# Patient Record
Sex: Female | Born: 1985 | Race: Black or African American | Hispanic: No | Marital: Single | Smoking: Current every day smoker
Health system: Southern US, Community
[De-identification: ages and names within clinical notes are randomized; demographics above are authoritative.]

## PROBLEM LIST (undated history)

## (undated) DIAGNOSIS — I10 Essential (primary) hypertension: Secondary | ICD-10-CM

## (undated) HISTORY — PX: NO PAST SURGERIES: SHX2092

---
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
## 2009-08-19 ENCOUNTER — Emergency Department (HOSPITAL_COMMUNITY): Admission: EM | Admit: 2009-08-19 | Discharge: 2009-08-19 | Payer: Self-pay | Admitting: Family Medicine

## 2009-09-22 ENCOUNTER — Ambulatory Visit: Payer: Self-pay | Admitting: Nurse Practitioner

## 2009-09-22 ENCOUNTER — Inpatient Hospital Stay (HOSPITAL_COMMUNITY): Admission: AD | Admit: 2009-09-22 | Discharge: 2009-09-22 | Payer: Self-pay | Admitting: Family Medicine

## 2009-10-27 ENCOUNTER — Emergency Department (HOSPITAL_COMMUNITY): Admission: EM | Admit: 2009-10-27 | Discharge: 2009-10-27 | Payer: Self-pay | Admitting: Emergency Medicine

## 2009-12-13 ENCOUNTER — Inpatient Hospital Stay (HOSPITAL_COMMUNITY): Admission: AD | Admit: 2009-12-13 | Discharge: 2009-12-13 | Payer: Self-pay | Admitting: Obstetrics and Gynecology

## 2010-01-12 ENCOUNTER — Inpatient Hospital Stay (HOSPITAL_COMMUNITY): Admission: AD | Admit: 2010-01-12 | Discharge: 2010-01-12 | Payer: Self-pay | Admitting: Obstetrics & Gynecology

## 2010-01-16 ENCOUNTER — Ambulatory Visit (HOSPITAL_COMMUNITY): Admission: RE | Admit: 2010-01-16 | Discharge: 2010-01-16 | Payer: Self-pay | Admitting: Obstetrics & Gynecology

## 2010-04-19 ENCOUNTER — Inpatient Hospital Stay (HOSPITAL_COMMUNITY)
Admission: AD | Admit: 2010-04-19 | Discharge: 2010-04-19 | Payer: Self-pay | Source: Home / Self Care | Attending: Obstetrics & Gynecology | Admitting: Obstetrics & Gynecology

## 2010-04-19 LAB — URINALYSIS, ROUTINE W REFLEX MICROSCOPIC
Bilirubin Urine: NEGATIVE
Nitrite: NEGATIVE
Specific Gravity, Urine: 1.02 (ref 1.005–1.030)
Urobilinogen, UA: 0.2 mg/dL (ref 0.0–1.0)

## 2010-04-19 LAB — CBC
HCT: 36.2 % (ref 36.0–46.0)
Hemoglobin: 12.1 g/dL (ref 12.0–15.0)
MCH: 27.3 pg (ref 26.0–34.0)
MCHC: 33.4 g/dL (ref 30.0–36.0)
MCV: 81.5 fL (ref 78.0–100.0)
Platelets: 195 10*3/uL (ref 150–400)
RBC: 4.44 MIL/uL (ref 3.87–5.11)
RDW: 14 % (ref 11.5–15.5)
WBC: 8.1 10*3/uL (ref 4.0–10.5)

## 2010-04-19 LAB — COMPREHENSIVE METABOLIC PANEL
ALT: 12 U/L (ref 0–35)
AST: 21 U/L (ref 0–37)
CO2: 21 mEq/L (ref 19–32)
Chloride: 105 mEq/L (ref 96–112)
Creatinine, Ser: 0.89 mg/dL (ref 0.4–1.2)
GFR calc Af Amer: >60 mL/min (ref >60)
GFR calc non Af Amer: >60 mL/min (ref >60)
Glucose, Bld: 70 mg/dL (ref 70–99)
Sodium: 135 mEq/L (ref 135–145)
Total Bilirubin: 0.5 mg/dL (ref 0.3–1.2)

## 2010-04-19 LAB — WET PREP, GENITAL
Clue Cells Wet Prep HPF POC: NONE SEEN
Trich, Wet Prep: NONE SEEN

## 2010-04-19 LAB — URINE MICROSCOPIC-ADD ON

## 2010-04-21 ENCOUNTER — Encounter (INDEPENDENT_AMBULATORY_CARE_PROVIDER_SITE_OTHER): Payer: Self-pay | Admitting: *Deleted

## 2010-04-21 LAB — STREP B DNA PROBE: Strep Group B Ag: NEGATIVE

## 2010-04-21 LAB — CONVERTED CEMR LAB: Protein, 24H Urine: 86 mg/24hr (ref 50–100)

## 2010-04-23 ENCOUNTER — Ambulatory Visit: Admit: 2010-04-23 | Payer: Self-pay | Admitting: Obstetrics and Gynecology

## 2010-04-23 ENCOUNTER — Inpatient Hospital Stay (HOSPITAL_COMMUNITY)
Admission: AD | Admit: 2010-04-23 | Discharge: 2010-04-25 | DRG: 775 | Disposition: A | Discharge status: Home / Self Care | Payer: Medicaid Other | Attending: Obstetrics & Gynecology | Admitting: Obstetrics & Gynecology

## 2010-04-23 DIAGNOSIS — O0930 Supervision of pregnancy with insufficient antenatal care, unspecified trimester: Secondary | ICD-10-CM

## 2010-04-23 DIAGNOSIS — O139 Gestational [pregnancy-induced] hypertension without significant proteinuria, unspecified trimester: Principal | ICD-10-CM | POA: Diagnosis present

## 2010-04-23 LAB — COMPREHENSIVE METABOLIC PANEL
ALT: 10 U/L (ref 0–35)
CO2: 22 mEq/L (ref 19–32)
Calcium: 8.9 mg/dL (ref 8.4–10.5)
GFR calc non Af Amer: >60 mL/min (ref >60)
Glucose, Bld: 86 mg/dL (ref 70–99)
Sodium: 132 mEq/L — ABNORMAL LOW (ref 135–145)

## 2010-04-23 LAB — CBC
HCT: 34.1 % — ABNORMAL LOW (ref 36.0–46.0)
HCT: 33.8 % — ABNORMAL LOW (ref 36.0–46.0)
Hemoglobin: 11.5 g/dL — ABNORMAL LOW (ref 12.0–15.0)
Hemoglobin: 11.3 g/dL — ABNORMAL LOW (ref 12.0–15.0)
MCH: 27.6 pg (ref 26.0–34.0)
MCH: 27.1 pg (ref 26.0–34.0)
MCHC: 33.7 g/dL (ref 30.0–36.0)
MCV: 81.1 fL (ref 78.0–100.0)
Platelets: 186 10*3/uL (ref 150–400)
RBC: 4.17 MIL/uL (ref 3.87–5.11)

## 2010-04-23 LAB — URINALYSIS, ROUTINE W REFLEX MICROSCOPIC
Ketones, ur: NEGATIVE mg/dL
Leukocytes, UA: NEGATIVE
Nitrite: NEGATIVE
Protein, ur: NEGATIVE mg/dL
Urobilinogen, UA: 0.2 mg/dL (ref 0.0–1.0)

## 2010-04-23 LAB — RAPID URINE DRUG SCREEN, HOSP PERFORMED
Barbiturates: NOT DETECTED
Benzodiazepines: NOT DETECTED

## 2010-04-23 LAB — PROTEIN / CREATININE RATIO, URINE: Protein Creatinine Ratio: 0.21 — ABNORMAL HIGH (ref 0.00–0.15)

## 2010-04-23 LAB — URINE MICROSCOPIC-ADD ON

## 2010-05-24 ENCOUNTER — Inpatient Hospital Stay (INDEPENDENT_AMBULATORY_CARE_PROVIDER_SITE_OTHER)
Admission: RE | Admit: 2010-05-24 | Discharge: 2010-05-24 | Disposition: A | Discharge status: Home / Self Care | Payer: Medicaid Other | Source: Ambulatory Visit | Attending: Family Medicine | Admitting: Family Medicine

## 2010-05-24 DIAGNOSIS — S335XXA Sprain of ligaments of lumbar spine, initial encounter: Secondary | ICD-10-CM

## 2010-06-06 LAB — URINALYSIS, ROUTINE W REFLEX MICROSCOPIC
Nitrite: NEGATIVE
Specific Gravity, Urine: 1.02 (ref 1.005–1.030)
Urobilinogen, UA: 0.2 mg/dL (ref 0.0–1.0)

## 2010-06-06 LAB — HIV ANTIBODY (ROUTINE TESTING W REFLEX): HIV: NONREACTIVE

## 2010-06-06 LAB — TYPE AND SCREEN: ABO/RH(D): A POS

## 2010-06-06 LAB — HEPATITIS B SURFACE ANTIGEN: Hepatitis B Surface Ag: NEGATIVE

## 2010-06-06 LAB — CBC
MCH: 28.7 pg (ref 26.0–34.0)
MCHC: 34 g/dL (ref 30.0–36.0)
MCV: 84.4 fL (ref 78.0–100.0)
Platelets: 210 10*3/uL (ref 150–400)

## 2010-06-06 LAB — URINE MICROSCOPIC-ADD ON

## 2010-06-06 LAB — DIFFERENTIAL
Basophils Relative: 0 % (ref 0–1)
Eosinophils Absolute: 0.1 10*3/uL (ref 0.0–0.7)
Eosinophils Relative: 1 % (ref 0–5)
Monocytes Relative: 7 % (ref 3–12)
Neutrophils Relative %: 78 % — ABNORMAL HIGH (ref 43–77)

## 2010-06-07 LAB — URINALYSIS, ROUTINE W REFLEX MICROSCOPIC
Glucose, UA: NEGATIVE mg/dL
Ketones, ur: NEGATIVE mg/dL
Nitrite: NEGATIVE
Protein, ur: NEGATIVE mg/dL

## 2010-06-07 LAB — URINE MICROSCOPIC-ADD ON

## 2010-06-07 LAB — GC/CHLAMYDIA PROBE AMP, URINE
Chlamydia, Swab/Urine, PCR: NEGATIVE
GC Probe Amp, Urine: NEGATIVE

## 2010-08-10 NOTE — Op Note (Signed)
[facility]  Patient:    Bianca Jenkins, Bianca Jenkins Visit Number: [id number] MRN: [id number]          Service Type: OBS Location: [id number] Attending Physician:  Pleas Koch Dictated by:   Georgina Peer, M.D. Proc. Date: 07/20/01 Admit Date:  07/19/2001                             Operative Report  PREOPERATIVE DIAGNOSES:       1. Forty-one and 6/7 weeks intrauterine                                  pregnancy.                               2. Maternal fatigue, requests delivery                                  assistance with vacuum extractor.  POSTOPERATIVE DIAGNOSES:      1. Forty-one and 6/7 weeks intrauterine                                  pregnancy.                               2. Maternal fatigue, requests delivery                                  assistance with vacuum extractor with                                  viable female delivered.  OPERATION:  SURGEON:                      Georgina Peer, M.D.  ANESTHESIA:                   Epidural.  ESTIMATED BLOOD LOSS:         300 cc at delivery, laceration first degree perineal.  FINDINGS:                     Delivered a viable female infant at 2:45 a.m., Apgars 8 and 9.  INDICATIONS:                  A 25 year old primigravida, [facility] July 07, 2001,. presented in labor, progressing to complete dilatation at [facility] on April 28. Pushed well but became fatigued and at 2:40 a.m. requested assistance.  The vertex was at +3 OA.  Vacuum extractor was presented and accepted.  Risks and complications including caput formation, scalp bleeding, cephalhematoma, scalp or intracranial infection discussed and accepted.  It was thought that the patient would deliver within one or two pushes.  The Kiwi vacuum device was applied and within two pushes, the patient delivered at 2:45 a.m. a viable female infant.  Apgars at 8 and 9.  Loose nuchal cord x 1.  There was a first degree perineal  laceration.   It was a female infant.  Cord was doubly clamped and cut and infant cried spontaneously.  The placenta was delivered spontaneously at 2:50 a.m.  The laceration was repaired using 1% Xylocaine and chromic suture.  Estimated blood loss was 300 cc.  Circumcision desired.  Baby to regular nursery with a normal postpartum course. Dictated by:   Georgina Peer, M.D. Attending Physician:  Pleas Koch DD:  07/20/01 TD:  07/20/01 Job: [id number]

## 2012-07-26 ENCOUNTER — Encounter (HOSPITAL_COMMUNITY): Payer: Self-pay | Admitting: *Deleted

## 2012-07-26 ENCOUNTER — Inpatient Hospital Stay (HOSPITAL_COMMUNITY)
Admission: AD | Admit: 2012-07-26 | Discharge: 2012-07-26 | Disposition: A | Discharge status: Home / Self Care | Payer: Self-pay | Source: Ambulatory Visit | Attending: Obstetrics & Gynecology | Admitting: Obstetrics & Gynecology

## 2012-07-26 DIAGNOSIS — B9689 Other specified bacterial agents as the cause of diseases classified elsewhere: Secondary | ICD-10-CM

## 2012-07-26 DIAGNOSIS — A499 Bacterial infection, unspecified: Secondary | ICD-10-CM | POA: Insufficient documentation

## 2012-07-26 DIAGNOSIS — N949 Unspecified condition associated with female genital organs and menstrual cycle: Secondary | ICD-10-CM | POA: Insufficient documentation

## 2012-07-26 DIAGNOSIS — N760 Acute vaginitis: Secondary | ICD-10-CM

## 2012-07-26 HISTORY — DX: Essential (primary) hypertension: I10

## 2012-07-26 LAB — WET PREP, GENITAL: Trich, Wet Prep: NONE SEEN

## 2012-07-26 MED ORDER — METRONIDAZOLE 500 MG PO TABS: 500.0000 mg | ORAL_TABLET | Freq: Two times a day (BID) | ORAL | Status: DC

## 2012-07-26 NOTE — MAU Provider Note (Signed)
Attestation of Attending Supervision of Advanced Practitioner (PA/CNM/NP): Evaluation and management procedures were performed by the Advanced Practitioner under my supervision and collaboration.  I have reviewed the Advanced Practitioner's note and chart, and I agree with the management and plan.  Keyla Milone, MD, FACOG Attending Obstetrician & Gynecologist Faculty Practice, [facility]  

## 2012-07-26 NOTE — MAU Note (Signed)
Pt states has HTN and was given medicine after delivery last child 03/2010. Took medicine but when ran out has not taken any med for HTN

## 2012-07-26 NOTE — MAU Provider Note (Signed)
  History     CSN: [id number]  Arrival date and time: 07/26/12 0051   None     Chief Complaint  Patient presents with  . Vaginal Discharge   HPI  Pt is here with report of vaginal discharge with odor since my period stopped on 07/20/12.  No change in partners.  Uses condoms for birth control.  Patient's last menstrual period was 07/14/2012. Marland Kitchen    Past Medical History  Diagnosis Date  . Hypertension     Past Surgical History  Procedure Laterality Date  . No past surgeries      Family History  Problem Relation Age of Onset  . Hypertension Mother   . Hypertension Maternal Grandmother     History  Substance Use Topics  . Smoking status: Current Some Day Smoker  . Smokeless tobacco: Not on file  . Alcohol Use: No    Allergies: No Known Allergies  No prescriptions prior to admission    Review of Systems  Gastrointestinal: Negative for abdominal pain.  Genitourinary: Negative.        Yellow vaginal discharge + odor  All other systems reviewed and are negative.   Physical Exam   Blood pressure 149/94, pulse 54, temperature 98.1 F (36.7 C), resp. rate 18, height 5' (1.524 m), weight 63.504 kg (140 lb), last menstrual period 07/14/2012.  Physical Exam  Constitutional: She is oriented to person, place, and time. She appears well-developed and well-nourished. No distress.  HENT:  Head: Normocephalic.  Eyes: Pupils are equal, round, and reactive to light.  Neck: Normal range of motion. Neck supple.  Cardiovascular: Normal rate, regular rhythm and normal heart sounds.   Respiratory: Effort normal and breath sounds normal. No respiratory distress.  GI: Soft. She exhibits no distension. There is no tenderness. There is no CVA tenderness.  Genitourinary: Cervix exhibits no motion tenderness and no discharge. Vaginal discharge (white, creamy; +odor) found.  Musculoskeletal: Normal range of motion.  Neurological: She is alert and oriented to person, place, and time.   Skin: Skin is warm and dry.  Psychiatric: She has a normal mood and affect.    MAU Course  Procedures  Results for orders placed during the hospital encounter of 07/26/12 (from the past 24 hour(s))  POCT PREGNANCY, URINE     Status: None   Collection Time    07/26/12  1:39 AM      Result Value Range   Preg Test, Ur NEGATIVE  NEGATIVE  WET PREP, GENITAL     Status: Abnormal   Collection Time    07/26/12  2:15 AM      Result Value Range   Yeast Wet Prep HPF POC NONE SEEN  NONE SEEN   Trich, Wet Prep NONE SEEN  NONE SEEN   Clue Cells Wet Prep HPF POC MODERATE (*) NONE SEEN   WBC, Wet Prep HPF POC FEW (*) NONE SEEN    Assessment and Plan  Bacterial Vaginosis  Plan: DC to home RX Flagyl GC/CT pending  [facility] 07/26/2012, 2:02 AM

## 2012-07-26 NOTE — Progress Notes (Signed)
Written and verbal d/c instructions given and understanding voiced. 

## 2012-07-26 NOTE — MAU Note (Signed)
I've had vag d/c with odor since my period stopped about 4/28.

## 2012-07-27 LAB — GC/CHLAMYDIA PROBE AMP
CT Probe RNA: NEGATIVE
GC Probe RNA: NEGATIVE

## 2014-01-24 ENCOUNTER — Encounter (HOSPITAL_COMMUNITY): Payer: Self-pay | Admitting: *Deleted

## 2014-03-02 ENCOUNTER — Encounter (HOSPITAL_COMMUNITY): Payer: Self-pay | Admitting: Emergency Medicine

## 2014-03-02 ENCOUNTER — Emergency Department (HOSPITAL_COMMUNITY)
Admission: EM | Admit: 2014-03-02 | Discharge: 2014-03-02 | Disposition: A | Discharge status: Home / Self Care | Payer: Medicaid Other | Attending: Emergency Medicine | Admitting: Emergency Medicine

## 2014-03-02 DIAGNOSIS — Z792 Long term (current) use of antibiotics: Secondary | ICD-10-CM | POA: Insufficient documentation

## 2014-03-02 DIAGNOSIS — K047 Periapical abscess without sinus: Secondary | ICD-10-CM

## 2014-03-02 DIAGNOSIS — I10 Essential (primary) hypertension: Secondary | ICD-10-CM | POA: Insufficient documentation

## 2014-03-02 DIAGNOSIS — Z720 Tobacco use: Secondary | ICD-10-CM | POA: Insufficient documentation

## 2014-03-02 MED ORDER — PENICILLIN V POTASSIUM 500 MG PO TABS: 500.0000 mg | ORAL_TABLET | Freq: Four times a day (QID) | ORAL | Status: DC

## 2014-03-02 MED ORDER — HYDROCODONE-ACETAMINOPHEN 5-325 MG PO TABS: 1.0000 | ORAL_TABLET | ORAL | Status: DC | PRN

## 2014-03-02 NOTE — Discharge Instructions (Signed)
°  Read the information below.  Use the prescribed medication as directed.  Please discuss all new medications with your pharmacist.  Do not take additional tylenol while taking the prescribed pain medication to avoid overdose.  You may return to the Emergency Department at any time for worsening condition or any new symptoms that concern you.  Please call the dentist listed above within 48 hours to schedule a close follow up appointment.  If you develop fevers, swelling in your face, difficulty swallowing or breathing, return to the ER immediately for a recheck.     Dental Abscess A dental abscess is a collection of infected fluid (pus) from a bacterial infection in the inner part of the tooth (pulp). It usually occurs at the end of the tooth's root.  CAUSES   Severe tooth decay.  Trauma to the tooth that allows bacteria to enter into the pulp, such as a broken or chipped tooth. SYMPTOMS   Severe pain in and around the infected tooth.  Swelling and redness around the abscessed tooth or in the mouth or face.  Tenderness.  Pus drainage.  Bad breath.  Bitter taste in the mouth.  Difficulty swallowing.  Difficulty opening the mouth.  Nausea.  Vomiting.  Chills.  Swollen neck glands. DIAGNOSIS   A medical and dental history will be taken.  An examination will be performed by tapping on the abscessed tooth.  X-rays may be taken of the tooth to identify the abscess. TREATMENT The goal of treatment is to eliminate the infection. You may be prescribed antibiotic medicine to stop the infection from spreading. A root canal may be performed to save the tooth. If the tooth cannot be saved, it may be pulled (extracted) and the abscess may be drained.  HOME CARE INSTRUCTIONS  Only take over-the-counter or prescription medicines for pain, fever, or discomfort as directed by your caregiver.  Rinse your mouth (gargle) often with salt water ( tsp salt in 8 oz [250 ml] of warm water) to  relieve pain or swelling.  Do not drive after taking pain medicine (narcotics).  Do not apply heat to the outside of your face.  Return to your dentist for further treatment as directed. SEEK MEDICAL CARE IF:  Your pain is not helped by medicine.  Your pain is getting worse instead of better. SEEK IMMEDIATE MEDICAL CARE IF:  You have a fever or persistent symptoms for more than 2-3 days.  You have a fever and your symptoms suddenly get worse.  You have chills or a very bad headache.  You have problems breathing or swallowing.  You have trouble opening your mouth.  You have swelling in the neck or around the eye. Document Released: 03/11/2005 Document Revised: 12/04/2011 Document Reviewed: 06/19/2010 [facility] Patient Information 2015 [employer] [place]. This information is not intended to replace advice given to you by your health care provider. Make sure you discuss any questions you have with your health care provider.
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
## 2014-03-02 NOTE — ED Notes (Signed)
Initial Contact - pt reports cracked a tooth L lower jaw x3-4 months ago and reports onset pain/swelling last night.  Pt denies pain at that time, reports relieved by aleve.  +swelling noted to L lower jaw, pt denies swelling of tongue/throat or difficulty swallowing or maintaining airway.  Speaking full/clear sentences, rr even/un-lab.  Skin otherwise PWD.  Pt denies fevers/chills.  NAD.

## 2014-03-02 NOTE — ED Notes (Addendum)
Pt c/o dental pain/broken tooth x 3 -4 months, states yesterday she began to have swelling to left flower mouth. Pt took 2 aleve's x 1 hour ago

## 2014-04-16 ENCOUNTER — Encounter (HOSPITAL_COMMUNITY): Payer: Self-pay | Admitting: Emergency Medicine

## 2014-04-16 ENCOUNTER — Emergency Department (HOSPITAL_COMMUNITY)
Admission: EM | Admit: 2014-04-16 | Discharge: 2014-04-16 | Disposition: A | Discharge status: Home / Self Care | Payer: No Typology Code available for payment source | Attending: Emergency Medicine | Admitting: Emergency Medicine

## 2014-04-16 DIAGNOSIS — Y998 Other external cause status: Secondary | ICD-10-CM | POA: Diagnosis not present

## 2014-04-16 DIAGNOSIS — Y9389 Activity, other specified: Secondary | ICD-10-CM | POA: Insufficient documentation

## 2014-04-16 DIAGNOSIS — Z720 Tobacco use: Secondary | ICD-10-CM | POA: Diagnosis not present

## 2014-04-16 DIAGNOSIS — S161XXA Strain of muscle, fascia and tendon at neck level, initial encounter: Secondary | ICD-10-CM | POA: Insufficient documentation

## 2014-04-16 DIAGNOSIS — I10 Essential (primary) hypertension: Secondary | ICD-10-CM | POA: Diagnosis not present

## 2014-04-16 DIAGNOSIS — Y92410 Unspecified street and highway as the place of occurrence of the external cause: Secondary | ICD-10-CM | POA: Diagnosis not present

## 2014-04-16 DIAGNOSIS — Z792 Long term (current) use of antibiotics: Secondary | ICD-10-CM | POA: Diagnosis not present

## 2014-04-16 DIAGNOSIS — S199XXA Unspecified injury of neck, initial encounter: Secondary | ICD-10-CM | POA: Diagnosis present

## 2014-04-16 MED ORDER — NAPROXEN 500 MG PO TABS: 500.0000 mg | ORAL_TABLET | Freq: Two times a day (BID) | ORAL | Status: DC
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
## 2014-07-25 ENCOUNTER — Encounter (HOSPITAL_COMMUNITY): Payer: Self-pay | Admitting: Emergency Medicine

## 2014-07-25 ENCOUNTER — Emergency Department (HOSPITAL_COMMUNITY): Payer: Medicaid Other

## 2014-07-25 ENCOUNTER — Emergency Department (HOSPITAL_COMMUNITY)
Admission: EM | Admit: 2014-07-25 | Discharge: 2014-07-25 | Disposition: A | Discharge status: Home / Self Care | Payer: Medicaid Other | Attending: Emergency Medicine | Admitting: Emergency Medicine

## 2014-07-25 DIAGNOSIS — I10 Essential (primary) hypertension: Secondary | ICD-10-CM | POA: Insufficient documentation

## 2014-07-25 DIAGNOSIS — Y92092 Bedroom in other non-institutional residence as the place of occurrence of the external cause: Secondary | ICD-10-CM | POA: Insufficient documentation

## 2014-07-25 DIAGNOSIS — Y998 Other external cause status: Secondary | ICD-10-CM | POA: Insufficient documentation

## 2014-07-25 DIAGNOSIS — S93401A Sprain of unspecified ligament of right ankle, initial encounter: Secondary | ICD-10-CM | POA: Insufficient documentation

## 2014-07-25 DIAGNOSIS — Z791 Long term (current) use of non-steroidal anti-inflammatories (NSAID): Secondary | ICD-10-CM | POA: Insufficient documentation

## 2014-07-25 DIAGNOSIS — Y9389 Activity, other specified: Secondary | ICD-10-CM | POA: Insufficient documentation

## 2014-07-25 DIAGNOSIS — Z792 Long term (current) use of antibiotics: Secondary | ICD-10-CM | POA: Insufficient documentation

## 2014-07-25 DIAGNOSIS — X58XXXA Exposure to other specified factors, initial encounter: Secondary | ICD-10-CM | POA: Insufficient documentation

## 2014-07-25 DIAGNOSIS — Z720 Tobacco use: Secondary | ICD-10-CM | POA: Insufficient documentation

## 2014-07-25 DIAGNOSIS — S93601A Unspecified sprain of right foot, initial encounter: Secondary | ICD-10-CM | POA: Insufficient documentation

## 2014-07-25 MED ORDER — NAPROXEN 500 MG PO TABS: 500.0000 mg | ORAL_TABLET | Freq: Two times a day (BID) | ORAL | Status: DC

## 2014-07-25 NOTE — ED Notes (Signed)
Patient c/o right foot/ankle pain, twisted her ankle a week ago, pain worsened 3 days ago. Patient took ibuprofen and elevated foot with slight relief.

## 2014-07-25 NOTE — ED Notes (Signed)
MD at bedside. 

## 2014-07-25 NOTE — Discharge Instructions (Signed)
Ankle Sprain °An ankle sprain is an injury to the strong, fibrous tissues (ligaments) that hold the bones of your ankle joint together.  °CAUSES °An ankle sprain is usually caused by a fall or by twisting your ankle. Ankle sprains most commonly occur when you step on the outer edge of your foot, and your ankle turns inward. People who participate in sports are more prone to these types of injuries.  °SYMPTOMS  °· Pain in your ankle. The pain may be present at rest or only when you are trying to stand or walk. °· Swelling. °· Bruising. Bruising may develop immediately or within 1 to 2 days after your injury. °· Difficulty standing or walking, particularly when turning corners or changing directions. °DIAGNOSIS  °Your caregiver will ask you details about your injury and perform a physical exam of your ankle to determine if you have an ankle sprain. During the physical exam, your caregiver will press on and apply pressure to specific areas of your foot and ankle. Your caregiver will try to move your ankle in certain ways. An X-ray exam may be done to be sure a bone was not broken or a ligament did not separate from one of the bones in your ankle (avulsion fracture).  °TREATMENT  °Certain types of braces can help stabilize your ankle. Your caregiver can make a recommendation for this. Your caregiver may recommend the use of medicine for pain. If your sprain is severe, your caregiver may refer you to a surgeon who helps to restore function to parts of your skeletal system (orthopedist) or a physical therapist. °HOME CARE INSTRUCTIONS  °· Apply ice to your injury for 1-2 days or as directed by your caregiver. Applying ice helps to reduce inflammation and pain. °¨ Put ice in a plastic bag. °¨ Place a towel between your skin and the bag. °¨ Leave the ice on for 15-20 minutes at a time, every 2 hours while you are awake. °· Only take over-the-counter or prescription medicines for pain, discomfort, or fever as directed by  your caregiver. °· Elevate your injured ankle above the level of your heart as much as possible for 2-3 days. °· If your caregiver recommends crutches, use them as instructed. Gradually put weight on the affected ankle. Continue to use crutches or a cane until you can walk without feeling pain in your ankle. °· If you have a plaster splint, wear the splint as directed by your caregiver. Do not rest it on anything harder than a pillow for the first 24 hours. Do not put weight on it. Do not get it wet. You may take it off to take a shower or bath. °· You may have been given an elastic bandage to wear around your ankle to provide support. If the elastic bandage is too tight (you have numbness or tingling in your foot or your foot becomes cold and blue), adjust the bandage to make it comfortable. °· If you have an air splint, you may blow more air into it or let air out to make it more comfortable. You may take your splint off at night and before taking a shower or bath. Wiggle your toes in the splint several times per day to decrease swelling. °SEEK MEDICAL CARE IF:  °· You have rapidly increasing bruising or swelling. °· Your toes feel extremely cold or you lose feeling in your foot. °· Your pain is not relieved with medicine. °SEEK IMMEDIATE MEDICAL CARE IF: °· Your toes are numb or blue. °·   You have severe pain that is increasing. °MAKE SURE YOU:  °· Understand these instructions. °· Will watch your condition. °· Will get help right away if you are not doing well or get worse. °Document Released: 03/11/2005 Document Revised: 12/04/2011 Document Reviewed: 03/23/2011 °ExitCare® Patient Information ©2015 [employer]. This information is not intended to replace advice given to you by your health care provider. Make sure you discuss any questions you have with your health care provider. ° ° °Emergency Department Resource Guide °1) Find a Doctor and Pay Out of Pocket °Although you won't have to find out who is covered  by your insurance plan, it is a good idea to ask around and get recommendations. You will then need to call the office and see if the doctor you have chosen will accept you as a new patient and what types of options they offer for patients who are self-pay. Some doctors offer discounts or will set up payment plans for their patients who do not have insurance, but you will need to ask so you aren't surprised when you get to your appointment. ° °2) Contact Your Local Health Department °Not all health departments have doctors that can see patients for sick visits, but many do, so it is worth a call to see if yours does. If you don't know where your local health department is, you can check in your phone book. The CDC also has a tool to help you locate your state's health department, and many state websites also have listings of all of their local health departments. ° °3) Find a Walk-in Clinic °If your illness is not likely to be very severe or complicated, you may want to try a walk in clinic. These are popping up all over the country in pharmacies, drugstores, and shopping centers. They're usually staffed by nurse practitioners or physician assistants that have been trained to treat common illnesses and complaints. They're usually fairly quick and inexpensive. However, if you have serious medical issues or chronic medical problems, these are probably not your best option. ° °No Primary Care Doctor: °- Call [facility] at  832-8000 - they can help you locate a primary care doctor that  accepts your insurance, provides certain services, etc. °- Physician Referral Service- 1-800-533-3463 ° °Chronic Pain Problems: °Organization         Address  Phone   Notes  °[facility]  (336) 297-2271 Patients need to be referred by their primary care doctor.  ° °Medication Assistance: °Organization         Address  Phone   Notes  °[place] [facility] [address] °[place], [place] [address] (336) 641-8030 --Must be a resident of [place] °-- Must have NO insurance coverage whatsoever (no Medicaid/ Medicare, etc.) °-- The pt. MUST have a primary care doctor that directs their care regularly and follows them in the community °  °[facility]  (866) 331-1348   °[employer]  (888) 892-1162   ° °Agencies that provide inexpensive medical care: °Organization         Address  Phone   Notes  °[facility]  (336) 832-8035   °[facility]    (336) 832-7272   °[facility] [address] °[place], [place] [address] (336) 832-4777   °[facility] [place] [address], °[place] (336) 271-4999   °[facility]    (336) 373-0678   °[facility]    (  336) 272-1050   °[facility] ° [address], [place] Phone:  (336) 832-4444, Fax:  (336) 832-4440 Hours of Operation:  9 am - 6 pm, M-F.  Also accepts Medicaid/Medicare and self-pay.  °[facility] ° [address], [place] Phone: (336) 832-3150, Fax: (336) 832-3151. Hours of Operation:  8:30 am - 5:30 pm, M-F.  Also accepts Medicaid and self-pay.  °[facility] [place] [address], [place] Phone: (336) 878-6027   °[facility] [address], [place], [place] (336)723-1848, Ext. 123 Mondays & Thursdays: 7-9 AM.  First 15 patients are seen on a first come, first serve basis. °  ° °Medicaid-accepting [place] Providers: ° °Organization         Address  Phone   Notes  °[facility] [address], [place] (336) 641-2100 Also accepts self-pay patients.  °[facility] [address], [place] ° (336) 856-9996   °[facility] [address], [place] (336) 288-8857   °[facility] [address] [place] [address], [place] (336) 299-7000   °Veita Bland [address], [place]  ° (336) 373-1557 Only accepts [place] Access Medicaid patients after they have their name applied to their card.  ° °Self-Pay (no insurance) in [place]: ° °Organization         Address  Phone   Notes  °Sickle Cell Patients, [facility] [address], [place] (336) 832-1970   °[facility] [address], [place] (336) 832-4400   °[facility] ° [address], [place] (336) 992-4800   °[facility]/Dr. Osei-Bonsu ° [address] [place] [address], [place] or [address], [place] (336) 841-8500 Phone number for both [place] and [place] locations is the same.  °[facility] [address], [place] (336) 299-0000   °[facility] [address] [place] [address], [place] or [address] (336) 852-7530 °(336) 878-2260   °[facility] [address], [place] (336) 350-1642, phone; (336) 294-5005, fax Sees patients 1st and 3rd Saturday of every month.  Must not qualify for public or private insurance (i.e. Medicaid, Medicare, [place] Health Choice, Veterans' Benefits) • Household income should be no more than 200% of the poverty level •The clinic cannot treat you if you are pregnant or think you are pregnant • Sexually transmitted diseases are not treated at the clinic.  ° ° °Dental Care: °Organization         Address  Phone  Notes  °[facility] [address], [place] (336) 641-6152 Accepts children up to age 21 who are enrolled in Medicaid or [place] Health Choice; pregnant women with a Medicaid card; and children who have applied for Medicaid or [place] Health Choice, but were declined, whose parents can pay a reduced fee at time of service.  °[facility] [place]  [address], [place] (336) 641-7733 Accepts children up to age 21 who are enrolled  in Medicaid or [place] Health Choice; pregnant women with a Medicaid card; and children who have applied for Medicaid or [place] Health Choice, but were declined, whose parents can pay a reduced fee at time of service.  °[facility] ° [address], [place] (336) 641-4533 Patients are seen by appointment only. Walk-ins are not accepted. [facility] will see patients 18 years of age and older. °Monday - Tuesday (8am-5pm) °Most Wednesdays (8:30-5pm) °$30 per visit, cash only  °[facility] ° [address], [place] (336) 641-4533 Patients are seen by appointment only. Walk-ins are not accepted. [facility] will see patients 18 years of age and older. °One Wednesday Evening (Monthly: Volunteer Based).  $30 per visit, cash only  °[facility]  (919) 537-3737 for adults; Children under age 4, call Graduate Pediatric Dentistry at (919) [phone]. Children aged 4-14, please call (919) [phone] to request a pediatric application. ° Dental services are provided in all areas of dental care including fillings, crowns and bridges, complete and partial dentures, implants, gum treatment, root canals, and extractions. Preventive care is also provided. Treatment is provided to both adults and children. °Patients are selected via a lottery and there is often a waiting list. °  °[facility] [address], °[place] ° (336) 763-8833 [email or web address] °  °[facility] [address], [place], [place] (336)723-1848, Ext. 123 Second and Fourth Thursday of each month, opens at 6:30 AM; Clinic ends at 9 AM.  Patients are seen on a first-come first-served basis, and a limited number are seen during each clinic.  ° °[facility] ° [address], [place], [place] (336) 723-7904   Eligibility Requirements °You must have lived in [place], [place], or [place] counties for at least the last three months. °  You cannot be  eligible for state or federal sponsored healthcare insurance, including Veterans Administration, Medicaid, or Medicare. °  You generally cannot be eligible for healthcare insurance through your employer.  °  How to apply: °Eligibility screenings are held every Tuesday and Wednesday afternoon from 1:00 pm until 4:00 pm. You do not need an appointment for the interview!  °[facility] [address], [place], [place] 336-631-2330   °[facility] [facility]  336-342-8273   °[facility]  336-703-3100   °[facility]  336-570-6415   ° °Behavioral Health Resources in the Community: °Intensive Outpatient Programs °Organization         Address  Phone  Notes  °[facility] [address], [place], [place] 336-878-6098   °[facility] [address], [place], [place] 336-832-9800   °[facility] [address], [place], [place] ° 336-882-2125   °[facility] [address],  °[place], [place] 1-800-853-5163 or 336-641-4981   °Substance Abuse Resources °Organization         Address  Phone  Notes  °[facility]  336-882-2125   °[facility]  336-784-9470   °[facility]  336-285-9073   °[facility]  336-845-3988   °Residential & Outpatient Substance Abuse Program  1-800-659-3381   °Psychological Services °Organization         Address  Phone  Notes  °[facility]  336- 832-9600   °[facility]  336- 378-7881   °[facility] [address], [place] 1-800-853-5163 or 336-641-4981   ° °Mobile Crisis Teams °Organization         Address  Phone  Notes  °[facility], Mobile Crisis Care Unit  1-877-626-1772   °Assertive °[facility] ° [address] [place], [place]   336-834-9664   °Sharon DeEsch [address] °[place] 336-554-5454   ° °Self-Help/Support Groups °Organization          Address  Phone             Notes  °[facility] [place] - variety of support groups  336- 373-1402 Call for more information  °Narcotics Anonymous (NA), [facility] [address], °[place]  2 meetings at this location  ° °Residential Treatment Programs °Organization         Address  Phone  Notes  °[facility] [address],    °[place]  1-866-801-8205   °[facility] ° [address], [place], [place] 704-293-8524   °[facility] [address], [place] 336-845-3988 Admissions: 8am-3pm M-F  °[facility] [address]    °[place], [place] 336-841-1104   °[facility] [address], [place], [place] 336-379-7146   °[facility] [address]  °[place], [place] 336-285-9073   °[facility] Intensive Outpatient [address], [place], [place] 336-852-3033   °[facility] [address]  °[place], [place] 1-877-615-2722 or 336-784-9470   °[facility] [address] [place], [place] 336-227-7417 Accepts Medicaid  °[facility] [address]  °[place] 1-800-659-3381 Substance Abuse/Addiction Treatment  ° °[place] Behavioral Health Resources °Organization         Address  Phone  Notes  °[facility]  (888) 581-9988   °Julie Brannon, PhD [address] [place], [place]   (336) 349-5553 or (336) 951-0000   °[facility]   [address] °[place], [place] (336) 349-4454   °[facility] [address], [place], [place] (336) 342-8316 Insurance/Medicaid/sponsorship through [facility]  °[facility] [address]                                    [place],  (336) 342-8316 Therapy/tele-psych/case  °[facility] [address]  ° [place],  (336) 349-2233    °Dr. Arfeen  (336) 349-4544   °[facility] [place]  [employer]  [place] [facility] 1) [address], [place] °2) [address], [place] °3)  [address], [place] (336) 349-3220 °(336) 342-7768 ° °(336) 342-8140   °[place] [facility] (336) 342-1394 or (336) 342-3537 (After Hours)    ° ° ° °

## 2014-07-25 NOTE — ED Provider Notes (Signed)
CSN: [id number]     Arrival date & time 07/25/14  0744 History   First MD Initiated Contact with Patient 07/25/14 [phone]     Chief Complaint  Patient presents with  . Foot Pain    right     (Consider location/radiation/quality/duration/timing/severity/associated sxs/prior Treatment) HPI The patient states she twisted her ankle approximately a week ago getting out of bed too quickly one morning. This was the right ankle. She didn't think too much about it. She reports it was slightly swollen at the time. She was getting some improvement however 3 days ago she started getting increasing pain that was occurring in the forefoot and the top of her foot. It's discomfort with weightbearing. The patient continues to be ambulatory. 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 Past Medical History  Diagnosis Date  . Hypertension    Past Surgical History  Procedure Laterality Date  . No past surgeries     Family History  Problem Relation Age of Onset  . Hypertension Mother   . Hypertension Maternal Grandmother    History  Substance Use Topics  . Smoking status: Current Every Day Smoker -- 0.10 packs/day  . Smokeless tobacco: Not on file  . Alcohol Use: No   OB History    Gravida Para Term Preterm AB TAB SAB Ectopic Multiple Living   2 2 2  0 0 0 0 0 0 2     Review of Systems  Constitutional: No fever chills or recent illness.  Allergies  Review of patient's allergies indicates no known allergies.  Home Medications   Prior to Admission medications   Medication Sig Start Date End Date Taking? Authorizing Provider  ibuprofen (ADVIL,MOTRIN) 200 MG tablet Take 400 mg by mouth every 6 (six) hours as needed for mild pain or moderate pain.   Yes Historical Provider, MD  HYDROcodone-acetaminophen (NORCO/VICODIN) 5-325 MG per tablet Take 1-2 tablets by mouth every 4 (four) hours as needed for moderate pain or severe pain. Patient not taking: Reported on 07/25/2014 03/02/14   Trixie DredgeEmily West,  PA-C  metroNIDAZOLE (FLAGYL) 500 MG tablet Take 1 tablet (500 mg total) by mouth 2 (two) times daily. Patient not taking: Reported on 07/25/2014 07/26/12   Marlis EdelsonWalidah N Karim, CNM  naproxen (NAPROSYN) 500 MG tablet Take 1 tablet (500 mg total) by mouth 2 (two) times daily. Patient not taking: Reported on 07/25/2014 04/16/14   Azalia BilisKevin Campos, MD  naproxen (NAPROSYN) 500 MG tablet Take 1 tablet (500 mg total) by mouth 2 (two) times daily. 07/25/14   Arby BarretteMarcy Nura Cahoon, MD  penicillin v potassium (VEETID) 500 MG tablet Take 1 tablet (500 mg total) by mouth 4 (four) times daily. Patient not taking: Reported on 07/25/2014 03/02/14   Trixie DredgeEmily West, PA-C   BP 156/94 mmHg  Pulse 59  Temp(Src) 97.8 F (36.6 C) (Oral)  Resp 16  SpO2 98%  LMP 07/13/2014 (Approximate) Physical Exam  Constitutional: She is oriented to person, place, and time. She appears well-developed and well-nourished.  HENT:  Head: Normocephalic and atraumatic.  Eyes: EOM are normal.  Pulmonary/Chest: Effort normal.  Musculoskeletal:  Objectively the right ankle and foot are normal appearance without swelling. Patient has some mild discomfort on the lateral malleolus with ankle inversion. She does experience some discomfort with forced extension of the foot. But her cells pedis pulses are intact. The skin is warm and dry without erythema.  Neurological: She is alert and oriented to person, place, and time. Coordination normal.  Skin: Skin is  warm and dry.  Psychiatric: She has a normal mood and affect.    ED Course  Procedures (including critical care time) Labs Review Labs Reviewed - No data to display  Imaging Review Dg Ankle Complete Right  07/25/2014   CLINICAL DATA:  29 year old female status post twisting injury with lateral foot an ankle pain x1 week, increasing. Initial encounter.  EXAM: RIGHT ANKLE - COMPLETE 3+ VIEW  COMPARISON:  Right foot series from same day reported separately.  FINDINGS: Bone mineralization is within normal limits.  Normal mortise joint alignment. Normal talar dome. Calcaneus intact. Distal fibula and tibia intact. No definite joint effusion. No acute osseous abnormality identified.  IMPRESSION: No acute osseous abnormality identified about the right ankle.   Electronically Signed   By: Odessa Fleming M.D.   On: 07/25/2014 08:36   Dg Foot Complete Right  07/25/2014   CLINICAL DATA:  29 year old female status post twisting injury with lateral foot and ankle pain x1 week. Initial encounter.  EXAM: RIGHT FOOT COMPLETE - 3+ VIEW  COMPARISON:  None.  FINDINGS: Calcaneus intact. Bone mineralization is within normal limits. Joint spaces and alignment within normal limits. No acute osseous abnormality identified.  IMPRESSION: No acute osseous abnormality identified in the right foot.   Electronically Signed   By: Odessa Fleming M.D.   On: 07/25/2014 08:35     EKG Interpretation None      MDM   Final diagnoses:  Ankle sprain, right, initial encounter  Foot sprain, right, initial encounter   The patient's injury occurred approximately one week ago. At this point pain has moved predominantly into the forefoot with weightbearing. Most likely pattern here is some nerve compression with changed weightbearing pattern. The patient will be placed in an ankle air cast for additional support instability. She is given information for orthopedic follow-up and reevaluation.    Arby Barrette, MD 07/25/14 [phone]

## 2014-07-25 NOTE — ED Notes (Signed)
Patient transported to X-ray 

## 2018-06-12 ENCOUNTER — Emergency Department (HOSPITAL_COMMUNITY)
Admission: EM | Admit: 2018-06-12 | Discharge: 2018-06-12 | Disposition: A | Discharge status: Home / Self Care | Payer: Self-pay | Attending: Emergency Medicine | Admitting: Emergency Medicine

## 2018-06-12 ENCOUNTER — Other Ambulatory Visit: Payer: Self-pay

## 2018-06-12 ENCOUNTER — Encounter (HOSPITAL_COMMUNITY): Payer: Self-pay | Admitting: *Deleted

## 2018-06-12 DIAGNOSIS — Z79899 Other long term (current) drug therapy: Secondary | ICD-10-CM | POA: Insufficient documentation

## 2018-06-12 DIAGNOSIS — K047 Periapical abscess without sinus: Secondary | ICD-10-CM | POA: Insufficient documentation

## 2018-06-12 DIAGNOSIS — F17210 Nicotine dependence, cigarettes, uncomplicated: Secondary | ICD-10-CM | POA: Insufficient documentation

## 2018-06-12 DIAGNOSIS — I10 Essential (primary) hypertension: Secondary | ICD-10-CM | POA: Insufficient documentation

## 2018-06-12 MED ORDER — PENICILLIN V POTASSIUM 500 MG PO TABS: 500.0000 mg | ORAL_TABLET | Freq: Four times a day (QID) | ORAL | 0 refills | Status: DC

## 2018-06-12 MED ORDER — TRAMADOL HCL 50 MG PO TABS: 50.0000 mg | ORAL_TABLET | Freq: Four times a day (QID) | ORAL | 0 refills | Status: DC | PRN

## 2018-06-12 MED ORDER — KETOROLAC TROMETHAMINE 60 MG/2ML IM SOLN
60.0000 mg | Freq: Once | INTRAMUSCULAR | Status: AC
  Administered 2018-06-12: 60 mg via INTRAMUSCULAR
  Filled 2018-06-12: qty 2

## 2018-06-12 MED ORDER — IBUPROFEN 800 MG PO TABS: 800.0000 mg | ORAL_TABLET | Freq: Three times a day (TID) | ORAL | 0 refills | Status: DC | PRN

## 2018-06-12 NOTE — Discharge Instructions (Addendum)
Return here as needed. Follow up with your dentist.  °

## 2018-06-12 NOTE — ED Triage Notes (Signed)
Pt c/o right lower wisdom toothache x 1 week.  Pt stated "I got an appt.with a dentist next Friday."

## 2018-06-12 NOTE — ED Provider Notes (Signed)
[facility]-EMERGENCY DEPT Provider Note   CSN: [id number] Arrival date & time: 06/12/18  4827    History   Chief Complaint Chief Complaint  Patient presents with  . Dental Pain    HPI Bianca Jenkins is a 33 y.o. female.     HPI Patient presents to the emergency department with right lower dental pain that started 1 week ago but is worsened tonight.  Patient states last few days she did not have any significant pain.  Patient states that she has had no nausea vomiting, fever, difficulty swallowing, difficulty breathing, throat swelling, tongue swelling or syncope.  Patient states she took some ibuprofen and Goody's powder at home without significant relief of her symptoms. Past Medical History:  Diagnosis Date  . Hypertension     There are no active problems to display for this patient.   Past Surgical History:  Procedure Laterality Date  . NO PAST SURGERIES       OB History    Gravida  2   Para  2   Term  2   Preterm  0   AB  0   Living  2     SAB  0   TAB  0   Ectopic  0   Multiple  0   Live Births               Home Medications    Prior to Admission medications   Medication Sig Start Date End Date Taking? Authorizing Provider  HYDROcodone-acetaminophen (NORCO/VICODIN) 5-325 MG per tablet Take 1-2 tablets by mouth every 4 (four) hours as needed for moderate pain or severe pain. Patient not taking: Reported on 07/25/2014 03/02/14   Trixie Dredge, PA-C  ibuprofen (ADVIL,MOTRIN) 200 MG tablet Take 400 mg by mouth every 6 (six) hours as needed for mild pain or moderate pain.    [provider]  metroNIDAZOLE (FLAGYL) 500 MG tablet Take 1 tablet (500 mg total) by mouth 2 (two) times daily. Patient not taking: Reported on 07/25/2014 07/26/12   Tommi Emery N, CNM  naproxen (NAPROSYN) 500 MG tablet Take 1 tablet (500 mg total) by mouth 2 (two) times daily. Patient not taking: Reported on 07/25/2014 04/16/14   Azalia Bilis, MD  naproxen (NAPROSYN) 500 MG tablet Take 1 tablet (500 mg total) by mouth 2 (two) times daily. 07/25/14   Arby Barrette, MD  penicillin v potassium (VEETID) 500 MG tablet Take 1 tablet (500 mg total) by mouth 4 (four) times daily. Patient not taking: Reported on 07/25/2014 03/02/14   Trixie Dredge, PA-C    Family History Family History  Problem Relation Age of Onset  . Hypertension Mother   . Hypertension Maternal Grandmother     Social History Social History   Tobacco Use  . Smoking status: Current Every Day Smoker    Packs/day: 0.10  . Smokeless tobacco: Never Used  Substance Use Topics  . Alcohol use: No  . Drug use: No     Allergies   Patient has no known allergies.   Review of Systems Review of Systems All other systems negative except as documented in the HPI. All pertinent positives and negatives as reviewed in the HPI.  Physical Exam Updated Vital Signs BP (!) 187/107 (BP Location: Left Arm)   Pulse 68   Temp 98.4 F (36.9 C) (Oral)   Resp 18   Ht 5' (1.524 m)   Wt 70.3 kg   LMP 06/08/2018 (Approximate)   SpO2  100%   BMI 30.27 kg/m   Physical Exam Vitals signs and nursing note reviewed.  Constitutional:      General: She is not in acute distress.    Appearance: She is well-developed.  HENT:     Head: Normocephalic and atraumatic.     Mouth/Throat:     Dentition: Dental tenderness and gingival swelling present.     Pharynx: Oropharynx is clear. Uvula midline.  Eyes:     Pupils: Pupils are equal, round, and reactive to light.  Pulmonary:     Effort: Pulmonary effort is normal.  Skin:    General: Skin is warm and dry.  Neurological:     Mental Status: She is alert and oriented to person, place, and time.      ED Treatments / Results  Labs (all labs ordered are listed, but only abnormal results are displayed) Labs Reviewed - No data to display  EKG None  Radiology No results found.  Procedures Procedures (including critical  care time)  Medications Ordered in ED Medications  ketorolac (TORADOL) injection 60 mg (has no administration in time range)     Initial Impression / Assessment and Plan / ED Course  I have reviewed the triage vital signs and the nursing notes.  Pertinent labs & imaging results that were available during my care of the patient were reviewed by me and considered in my medical decision making (see chart for details).        Patient will be treated for dental infection.  She does have a dental appointment next Friday.  Patient is advised to return here for any worsening in her condition.  I have advised the patient to rinse with warm water peroxide 3 times a day. Final Clinical Impressions(s) / ED Diagnoses   Final diagnoses:  None    ED Discharge Orders    None       Charlestine Night, PA-C 06/12/18 0426    Ward, Layla Maw, DO 06/12/18 [phone]

## 2019-01-23 ENCOUNTER — Encounter (HOSPITAL_COMMUNITY): Payer: Self-pay | Admitting: Emergency Medicine

## 2019-01-23 ENCOUNTER — Other Ambulatory Visit: Payer: Self-pay

## 2019-01-23 ENCOUNTER — Emergency Department (HOSPITAL_COMMUNITY)
Admission: EM | Admit: 2019-01-23 | Discharge: 2019-01-24 | Disposition: A | Discharge status: Home / Self Care | Payer: Self-pay | Attending: Emergency Medicine | Admitting: Emergency Medicine

## 2019-01-23 DIAGNOSIS — F17210 Nicotine dependence, cigarettes, uncomplicated: Secondary | ICD-10-CM | POA: Insufficient documentation

## 2019-01-23 DIAGNOSIS — Z79899 Other long term (current) drug therapy: Secondary | ICD-10-CM | POA: Insufficient documentation

## 2019-01-23 DIAGNOSIS — K047 Periapical abscess without sinus: Secondary | ICD-10-CM | POA: Insufficient documentation

## 2019-01-23 DIAGNOSIS — I10 Essential (primary) hypertension: Secondary | ICD-10-CM | POA: Insufficient documentation

## 2019-01-23 DIAGNOSIS — R220 Localized swelling, mass and lump, head: Secondary | ICD-10-CM | POA: Insufficient documentation

## 2019-01-23 MED ORDER — CLONIDINE HCL 0.1 MG PO TABS
0.2000 mg | ORAL_TABLET | Freq: Once | ORAL | Status: AC
  Administered 2019-01-23: 0.2 mg via ORAL
  Filled 2019-01-23: qty 2

## 2019-01-23 NOTE — ED Triage Notes (Signed)
Patient complaining of migraine that she has had for two days. Patient states she has taken tylenol for it and it is not working. 

## 2019-01-23 NOTE — ED Notes (Signed)
Patient refused to get in gown and states she doesn't need blood work for blood pressure.

## 2019-01-23 NOTE — ED Triage Notes (Addendum)
Patient has abscess on right bottom tooth. Patient face is swollen. Patient states she missed her dental appointment. Patient blood pressure is 201/116. Patient is not currently on any medication. Patient has not been diagnosed with high blood pressure. Patient took an aleve about 1 hour 30 minutes ago.

## 2019-01-23 NOTE — ED Provider Notes (Signed)
[facility]-EMERGENCY DEPT Provider Note   CSN: [id number] Arrival date & time: 01/23/19  2032     History   Chief Complaint Chief Complaint  Patient presents with  . Hypertension  . Abscess    HPI Bianca Jenkins is a 33 y.o. female.     Patient presents to the emergency department for evaluation of dental pain.  Patient reports that she has been having severe right lower jaw pain for the last 2 days.  She does have a severely decayed tooth in this area.  She has noticed some mild facial swelling.  Patient noted to be hypertensive in triage, reports a history of but is no longer taking her meds.  She does not remember what she was prescribed.  She has not had any headache, blurred vision, chest pain, shortness of breath.     Past Medical History:  Diagnosis Date  . Hypertension     There are no active problems to display for this patient.   Past Surgical History:  Procedure Laterality Date  . NO PAST SURGERIES       OB History    Gravida  2   Para  2   Term  2   Preterm  0   AB  0   Living  2     SAB  0   TAB  0   Ectopic  0   Multiple  0   Live Births               Home Medications    Prior to Admission medications   Medication Sig Start Date End Date Taking? Authorizing Provider  amLODipine (NORVASC) 5 MG tablet Take 1 tablet (5 mg total) by mouth daily. 01/24/19   Gilda CreasePollina, Christopher J, MD  amoxicillin (AMOXIL) 500 MG capsule Take 1 capsule (500 mg total) by mouth 3 (three) times daily. 01/24/19   Gilda CreasePollina, Christopher J, MD  traMADol (ULTRAM) 50 MG tablet Take 1 tablet (50 mg total) by mouth every 6 (six) hours as needed. 01/24/19   Gilda CreasePollina, Christopher J, MD    Family History Family History  Problem Relation Age of Onset  . Hypertension Mother   . Hypertension Maternal Grandmother     Social History Social History   Tobacco Use  . Smoking status: Current Every Day Smoker    Packs/day: 0.10  . Smokeless  tobacco: Never Used  Substance Use Topics  . Alcohol use: No  . Drug use: No     Allergies   Patient has no known allergies.   Review of Systems Review of Systems  HENT: Positive for dental problem.   All other systems reviewed and are negative.    Physical Exam Updated Vital Signs BP (!) 196/113   Pulse 61   Temp 98.1 F (36.7 C) (Oral)   Resp 18   Ht 5' (1.524 m)   Wt 72.6 kg   SpO2 100%   BMI 31.25 kg/m   Physical Exam Vitals signs and nursing note reviewed.  Constitutional:      General: She is not in acute distress.    Appearance: Normal appearance. She is well-developed.  HENT:     Head: Normocephalic and atraumatic.     Right Ear: Hearing normal.     Left Ear: Hearing normal.     Nose: Nose normal.     Mouth/Throat:     Dentition: Dental tenderness, gingival swelling and dental caries present.   Eyes:  Conjunctiva/sclera: Conjunctivae normal.     Pupils: Pupils are equal, round, and reactive to light.  Neck:     Musculoskeletal: Normal range of motion and neck supple.  Cardiovascular:     Rate and Rhythm: Regular rhythm.     Heart sounds: S1 normal and S2 normal. No murmur. No friction rub. No gallop.   Pulmonary:     Effort: Pulmonary effort is normal. No respiratory distress.     Breath sounds: Normal breath sounds.  Chest:     Chest wall: No tenderness.  Abdominal:     General: Bowel sounds are normal.     Palpations: Abdomen is soft.     Tenderness: There is no abdominal tenderness. There is no guarding or rebound. Negative signs include Murphy's sign and McBurney's sign.     Hernia: No hernia is present.  Musculoskeletal: Normal range of motion.  Skin:    General: Skin is warm and dry.     Findings: No rash.  Neurological:     Mental Status: She is alert and oriented to person, place, and time.     GCS: GCS eye subscore is 4. GCS verbal subscore is 5. GCS motor subscore is 6.     Cranial Nerves: No cranial nerve deficit.      Sensory: No sensory deficit.     Coordination: Coordination normal.  Psychiatric:        Speech: Speech normal.        Behavior: Behavior normal.        Thought Content: Thought content normal.      ED Treatments / Results  Labs (all labs ordered are listed, but only abnormal results are displayed) Labs Reviewed  BASIC METABOLIC PANEL - Abnormal; Notable for the following components:      Result Value   Potassium 3.4 (*)    All other components within normal limits    EKG None  Radiology No results found.  Procedures Procedures (including critical care time)  Medications Ordered in ED Medications  cloNIDine (CATAPRES) tablet 0.2 mg (0.2 mg Oral Given 01/23/19 2346)     Initial Impression / Assessment and Plan / ED Course  I have reviewed the triage vital signs and the nursing notes.  Pertinent labs & imaging results that were available during my care of the patient were reviewed by me and considered in my medical decision making (see chart for details).        Patient presents to the ER for evaluation of toothache.  She has been experiencing facial swelling.  She has minimal swelling on exam, no evidence of large dental abscess.  She does not appear ill.  Patient noted to be hypertensive in triage.  She reports a history of hypertension but has been noncompliant with her medication.  She is not sure what medication she used to take for her blood pressure but it caused multiple side effects so she stopped it.  Patient is currently asymptomatic.  Some of her hypertension might be secondary to pain from the tooth ache, but she clearly has a previously diagnosed history of essential hypertension.  She was given clonidine with only minimal improvement.  As she is asymptomatic, will prescribe outpatient antihypertensive, NSAID and antibiotic for tooth, follow-up with dentist.  She will need to follow-up with PCP for recheck of her blood pressure as well.  Final Clinical  Impressions(s) / ED Diagnoses   Final diagnoses:  Dental abscess  Essential hypertension    ED Discharge Orders  Ordered    amoxicillin (AMOXIL) 500 MG capsule  3 times daily     01/24/19 0107    traMADol (ULTRAM) 50 MG tablet  Every 6 hours PRN     01/24/19 0107    amLODipine (NORVASC) 5 MG tablet  Daily     01/24/19 0107           Orpah Greek, MD 01/24/19 [phone]

## 2019-01-24 LAB — BASIC METABOLIC PANEL
Anion gap: 10 (ref 5–15)
BUN: 12 mg/dL (ref 6–20)
CO2: 24 mmol/L (ref 22–32)
Calcium: 9.4 mg/dL (ref 8.9–10.3)
Chloride: 106 mmol/L (ref 98–111)
Creatinine, Ser: 0.94 mg/dL (ref 0.44–1.00)
GFR calc Af Amer: >60 mL/min (ref >60)
GFR calc non Af Amer: >60 mL/min (ref >60)
Glucose, Bld: 97 mg/dL (ref 70–99)
Potassium: 3.4 mmol/L — ABNORMAL LOW (ref 3.5–5.1)
Sodium: 140 mmol/L (ref 135–145)

## 2019-01-24 MED ORDER — AMLODIPINE BESYLATE 5 MG PO TABS: 5.0000 mg | ORAL_TABLET | Freq: Every day | ORAL | 3 refills | Status: DC

## 2019-01-24 MED ORDER — TRAMADOL HCL 50 MG PO TABS: 50.0000 mg | ORAL_TABLET | Freq: Four times a day (QID) | ORAL | 0 refills | Status: DC | PRN

## 2019-01-24 MED ORDER — AMOXICILLIN 500 MG PO CAPS: 500.0000 mg | ORAL_CAPSULE | Freq: Three times a day (TID) | ORAL | 0 refills | Status: DC

## 2019-01-24 NOTE — Discharge Instructions (Signed)
Please schedule follow-up with a dentist for further treatment of your tooth ache.  You also need to schedule primary care follow-up to have your blood pressure rechecked and possibly have medications adjusted to control your blood pressure.

## 2019-03-26 NOTE — L&D Delivery Note (Addendum)
OB/GYN Faculty Practice Delivery Note  Bianca Jenkins is a 34 y.o. G3P3003 s/p VD at 1030 at [redacted]w[redacted]d. She was admitted for IOL cHTN with severe-range BP's, IUGR and acute onset vaginal bleeding/presumed abruption.   ROM: 4h 31m with clear and bloody fluid GBS Status: Negative Maximum Maternal Temperature: 98.5  Labor Progress: Induction with foley and Pitocin. Patient progressed to have VD without complications.   Delivery Date/Time: 11/30/19 at 10:30 AM Delivery: Called to room and patient was complete and pushing. Head delivered OA. No nuchal cord present. Shoulder and body delivered in usual fashion. Infant with spontaneous cry, placed on mother's abdomen, dried and stimulated. Cord clamped x 2 after 1-minute delay, and cut by Dr. Melba Coon. Cord blood drawn. Placenta delivered spontaneously with gentle cord traction. Fundus firm with massage and Pitocin. Labia, perineum, vagina, and cervix were inspected, no lacerations noted.   Placenta: Intact, 3-vessel cord Complications: None Lacerations: None EBL: 225cc Analgesia: Epidural    Infant: girl  APGARs 8, 9  4lbs 15.9oz  Sabino Dick PGY-1 Family Medicine   I was gloved and present for the entirety of the procedure as noted above.  Sheila Oats, MD OB Fellow, Faculty Practice 11/30/2019 12:13 PM

## 2019-07-12 ENCOUNTER — Ambulatory Visit (INDEPENDENT_AMBULATORY_CARE_PROVIDER_SITE_OTHER): Payer: Self-pay | Admitting: Obstetrics

## 2019-07-12 ENCOUNTER — Other Ambulatory Visit: Payer: Self-pay

## 2019-07-12 ENCOUNTER — Other Ambulatory Visit (HOSPITAL_COMMUNITY)
Admission: RE | Admit: 2019-07-12 | Discharge: 2019-07-12 | Disposition: A | Discharge status: Home / Self Care | Payer: Medicaid Other | Source: Ambulatory Visit | Attending: Obstetrics | Admitting: Obstetrics

## 2019-07-12 ENCOUNTER — Encounter: Payer: Self-pay | Admitting: Obstetrics

## 2019-07-12 VITALS — BP 147/91 | HR 76 | Wt 162.1 lb

## 2019-07-12 DIAGNOSIS — O10012 Pre-existing essential hypertension complicating pregnancy, second trimester: Secondary | ICD-10-CM

## 2019-07-12 DIAGNOSIS — I10 Essential (primary) hypertension: Secondary | ICD-10-CM

## 2019-07-12 DIAGNOSIS — O09899 Supervision of other high risk pregnancies, unspecified trimester: Secondary | ICD-10-CM | POA: Diagnosis not present

## 2019-07-12 DIAGNOSIS — Z3A17 17 weeks gestation of pregnancy: Secondary | ICD-10-CM

## 2019-07-12 DIAGNOSIS — Z3481 Encounter for supervision of other normal pregnancy, first trimester: Secondary | ICD-10-CM

## 2019-07-12 DIAGNOSIS — O119 Pre-existing hypertension with pre-eclampsia, unspecified trimester: Secondary | ICD-10-CM | POA: Insufficient documentation

## 2019-07-12 DIAGNOSIS — K029 Dental caries, unspecified: Secondary | ICD-10-CM

## 2019-07-12 MED ORDER — PRENATE MINI 29-0.6-0.4-350 MG PO CAPS: 1.0000 | ORAL_CAPSULE | Freq: Every day | ORAL | 3 refills | Status: AC

## 2019-07-12 MED ORDER — LABETALOL HCL 200 MG PO TABS: 200.0000 mg | ORAL_TABLET | Freq: Two times a day (BID) | ORAL | 5 refills | Status: DC

## 2019-07-12 NOTE — Progress Notes (Signed)
NOB in office, pt reports feeling fetal flutter movements, denies pain. Unplanned pregnancy, FOB is involved, not married, not living together. Pt states that she already has BP cuff at home.

## 2019-07-12 NOTE — Progress Notes (Signed)
Subjective:    Bianca Jenkins is being seen today for her first obstetrical visit.  This is not a planned pregnancy. She is at [redacted]w[redacted]d gestation. Her obstetrical history is significant for hypertension. Relationship with FOB: significant other, not living together. Patient does intend to breast feed. Pregnancy history fully reviewed.  The information documented in the HPI was reviewed and verified.  Menstrual History: OB History    Gravida  3   Para  2   Term  2   Preterm  0   AB  0   Living  2     SAB  0   TAB  0   Ectopic  0   Multiple  0   Live Births  2            Patient's last menstrual period was 03/14/2019.    Past Medical History:  Diagnosis Date  . Hypertension     Past Surgical History:  Procedure Laterality Date  . NO PAST SURGERIES      (Not in a hospital admission)  No Known Allergies  Social History   Tobacco Use  . Smoking status: Current Every Day Smoker    Packs/day: 0.10  . Smokeless tobacco: Never Used  Substance Use Topics  . Alcohol use: No    Family History  Problem Relation Age of Onset  . Hypertension Mother   . Hypertension Maternal Grandmother      Review of Systems Constitutional: negative for weight loss Gastrointestinal: negative for vomiting Genitourinary:negative for genital lesions and vaginal discharge and dysuria Musculoskeletal:negative for back pain Behavioral/Psych: negative for abusive relationship, depression, illegal drug usage and tobacco use    Objective:    BP (!) 147/91   Pulse 76   Wt 162 lb 1.6 oz (73.5 kg)   LMP 03/14/2019   BMI 31.66 kg/m  General Appearance:    Alert, cooperative, no distress, appears stated age  Head:    Normocephalic, without obvious abnormality, atraumatic  Eyes:    PERRL, conjunctiva/corneas clear, EOM's intact, fundi    benign, both eyes  Ears:    Normal TM's and external ear canals, both ears  Nose:   Nares normal, septum midline, mucosa normal, no drainage     or sinus tenderness  Throat:   Lips, mucosa, and tongue normal; teeth and gums normal  Neck:   Supple, symmetrical, trachea midline, no adenopathy;    thyroid:  no enlargement/tenderness/nodules; no carotid   bruit or JVD  Back:     Symmetric, no curvature, ROM normal, no CVA tenderness  Lungs:     Clear to auscultation bilaterally, respirations unlabored  Chest Wall:    No tenderness or deformity   Heart:    Regular rate and rhythm, S1 and S2 normal, no murmur, rub   or gallop  Breast Exam:    No tenderness, masses, or nipple abnormality  Abdomen:     Soft, non-tender, bowel sounds active all four quadrants,    no masses, no organomegaly  Genitalia:    Normal female without lesion, discharge or tenderness  Extremities:   Extremities normal, atraumatic, no cyanosis or edema  Pulses:   2+ and symmetric all extremities  Skin:   Skin color, texture, turgor normal, no rashes or lesions  Lymph nodes:   Cervical, supraclavicular, and axillary nodes normal  Neurologic:   CNII-XII intact, normal strength, sensation and reflexes    throughout      Lab Review Urine pregnancy test Labs reviewed yes  Radiologic studies reviewed no  Assessment:    Pregnancy at [redacted]w[redacted]d weeks    Plan:      Prenatal vitamins.  Counseling provided regarding continued use of seat belts, cessation of alcohol consumption, smoking or use of illicit drugs; infection precautions i.e., influenza/TDAP immunizations, toxoplasmosis,CMV, parvovirus, listeria and varicella; workplace safety, exercise during pregnancy; routine dental care, safe medications, sexual activity, hot tubs, saunas, pools, travel, caffeine use, fish and methlymercury, potential toxins, hair treatments, varicose veins Weight gain recommendations per IOM guidelines reviewed: underweight/BMI< 18.5--> gain 28 - 40 lbs; normal weight/BMI 18.5 - 24.9--> gain 25 - 35 lbs; overweight/BMI 25 - 29.9--> gain 15 - 25 lbs; obese/BMI >30->gain  11 - 20 lbs Problem  list reviewed and updated. FIRST/CF mutation testing/NIPT/QUAD SCREEN/fragile X/Ashkenazi Jewish population testing/Spinal muscular atrophy discussed: requested. Role of ultrasound in pregnancy discussed; fetal survey: requested. Amniocentesis discussed: not indicated. VBAC calculator score: VBAC consent form provided Meds ordered this encounter  Medications  . labetalol (NORMODYNE) 200 MG tablet    Sig: Take 1 tablet (200 mg total) by mouth 2 (two) times daily.    Dispense:  60 tablet    Refill:  5   Orders Placed This Encounter  Procedures  . Culture, OB Urine  . Obstetric Panel, Including HIV  . Genetic Screening  . Hepatitis C Antibody    Follow up in 4 weeks. 50% of 25 min visit spent on counseling and coordination of care.    Shelly Bombard, MD 07/12/2019 10:31 AM

## 2019-07-12 NOTE — Addendum Note (Signed)
Addended by: Coral Ceo A on: 07/12/2019 11:00 AM   Modules accepted: Orders

## 2019-07-13 ENCOUNTER — Other Ambulatory Visit: Payer: Self-pay | Admitting: Obstetrics

## 2019-07-13 DIAGNOSIS — B3731 Acute candidiasis of vulva and vagina: Secondary | ICD-10-CM

## 2019-07-13 DIAGNOSIS — A5901 Trichomonal vulvovaginitis: Secondary | ICD-10-CM

## 2019-07-13 DIAGNOSIS — B373 Candidiasis of vulva and vagina: Secondary | ICD-10-CM

## 2019-07-13 LAB — OBSTETRIC PANEL, INCLUDING HIV
Antibody Screen: NEGATIVE
Basophils Absolute: 0 10*3/uL (ref 0.0–0.2)
Basos: 0 %
EOS (ABSOLUTE): 0.1 10*3/uL (ref 0.0–0.4)
Eos: 1 %
HIV Screen 4th Generation wRfx: NONREACTIVE
Hematocrit: 34.2 % (ref 34.0–46.6)
Hemoglobin: 11.4 g/dL (ref 11.1–15.9)
Hepatitis B Surface Ag: NEGATIVE
Immature Grans (Abs): 0 10*3/uL (ref 0.0–0.1)
Immature Granulocytes: 0 %
Lymphocytes Absolute: 2.1 10*3/uL (ref 0.7–3.1)
Lymphs: 24 %
MCH: 27.4 pg (ref 26.6–33.0)
MCHC: 33.3 g/dL (ref 31.5–35.7)
MCV: 82 fL (ref 79–97)
Monocytes Absolute: 0.6 10*3/uL (ref 0.1–0.9)
Monocytes: 7 %
Neutrophils Absolute: 5.8 10*3/uL (ref 1.4–7.0)
Neutrophils: 68 %
Platelets: 270 10*3/uL (ref 150–450)
RBC: 4.16 x10E6/uL (ref 3.77–5.28)
RDW: 14.2 % (ref 11.7–15.4)
RPR Ser Ql: NONREACTIVE
Rh Factor: POSITIVE
Rubella Antibodies, IGG: 26.4 index (ref >0.99)
WBC: 8.6 10*3/uL (ref 3.4–10.8)

## 2019-07-13 LAB — CERVICOVAGINAL ANCILLARY ONLY
Bacterial Vaginitis (gardnerella): NEGATIVE
Candida Glabrata: NEGATIVE
Candida Vaginitis: POSITIVE — AB
Chlamydia: NEGATIVE
Comment: NEGATIVE
Comment: NEGATIVE
Comment: NEGATIVE
Comment: NEGATIVE
Comment: NEGATIVE
Comment: NORMAL
Neisseria Gonorrhea: NEGATIVE
Trichomonas: POSITIVE — AB

## 2019-07-13 LAB — HEPATITIS C ANTIBODY: Hep C Virus Ab: <0.1 s/co ratio (ref 0.0–0.9)

## 2019-07-13 LAB — CYTOLOGY - PAP
Comment: NEGATIVE
Diagnosis: NEGATIVE
High risk HPV: NEGATIVE

## 2019-07-13 MED ORDER — TERCONAZOLE 0.4 % VA CREA: 1.0000 | TOPICAL_CREAM | Freq: Every day | VAGINAL | 0 refills | Status: DC

## 2019-07-13 MED ORDER — METRONIDAZOLE 500 MG PO TABS: 500.0000 mg | ORAL_TABLET | Freq: Two times a day (BID) | ORAL | 2 refills | Status: DC

## 2019-07-14 LAB — CULTURE, OB URINE

## 2019-07-14 LAB — URINE CULTURE, OB REFLEX: Organism ID, Bacteria: NO GROWTH

## 2019-07-19 ENCOUNTER — Encounter: Payer: Self-pay | Admitting: Obstetrics

## 2019-07-22 ENCOUNTER — Encounter: Payer: Self-pay | Admitting: Obstetrics

## 2019-08-03 ENCOUNTER — Other Ambulatory Visit: Payer: Self-pay

## 2019-08-03 ENCOUNTER — Ambulatory Visit: Payer: Medicaid Other | Attending: Obstetrics

## 2019-08-03 ENCOUNTER — Ambulatory Visit: Payer: Medicaid Other | Admitting: *Deleted

## 2019-08-03 ENCOUNTER — Other Ambulatory Visit: Payer: Self-pay | Admitting: *Deleted

## 2019-08-03 VITALS — BP 123/65 | HR 61 | Temp 98.0°F

## 2019-08-03 DIAGNOSIS — O09899 Supervision of other high risk pregnancies, unspecified trimester: Secondary | ICD-10-CM | POA: Diagnosis present

## 2019-08-03 DIAGNOSIS — O99212 Obesity complicating pregnancy, second trimester: Secondary | ICD-10-CM | POA: Diagnosis not present

## 2019-08-03 DIAGNOSIS — O10012 Pre-existing essential hypertension complicating pregnancy, second trimester: Secondary | ICD-10-CM | POA: Diagnosis not present

## 2019-08-03 DIAGNOSIS — Z148 Genetic carrier of other disease: Secondary | ICD-10-CM

## 2019-08-03 DIAGNOSIS — E669 Obesity, unspecified: Secondary | ICD-10-CM | POA: Diagnosis not present

## 2019-08-03 DIAGNOSIS — Z363 Encounter for antenatal screening for malformations: Secondary | ICD-10-CM

## 2019-08-03 DIAGNOSIS — Z3A20 20 weeks gestation of pregnancy: Secondary | ICD-10-CM

## 2019-08-03 DIAGNOSIS — O0990 Supervision of high risk pregnancy, unspecified, unspecified trimester: Secondary | ICD-10-CM | POA: Insufficient documentation

## 2019-08-03 DIAGNOSIS — O10919 Unspecified pre-existing hypertension complicating pregnancy, unspecified trimester: Secondary | ICD-10-CM

## 2019-08-09 ENCOUNTER — Encounter: Payer: Self-pay | Admitting: Obstetrics

## 2019-08-09 ENCOUNTER — Telehealth (INDEPENDENT_AMBULATORY_CARE_PROVIDER_SITE_OTHER): Payer: Medicaid Other | Admitting: Obstetrics

## 2019-08-09 DIAGNOSIS — A5901 Trichomonal vulvovaginitis: Secondary | ICD-10-CM

## 2019-08-09 DIAGNOSIS — O10912 Unspecified pre-existing hypertension complicating pregnancy, second trimester: Secondary | ICD-10-CM

## 2019-08-09 DIAGNOSIS — Z3A21 21 weeks gestation of pregnancy: Secondary | ICD-10-CM

## 2019-08-09 DIAGNOSIS — O23592 Infection of other part of genital tract in pregnancy, second trimester: Secondary | ICD-10-CM

## 2019-08-09 DIAGNOSIS — I10 Essential (primary) hypertension: Secondary | ICD-10-CM

## 2019-08-09 DIAGNOSIS — O09899 Supervision of other high risk pregnancies, unspecified trimester: Secondary | ICD-10-CM

## 2019-08-09 NOTE — Progress Notes (Signed)
OBSTETRICS PRENATAL VIRTUAL VISIT ENCOUNTER NOTE  Provider location: [facility] [place]   I connected with [name] on 08/09/19 at 10:00 AM EDT by MyChart Video Encounter at home and verified that I am speaking with the correct person using two identifiers.   I discussed the limitations, risks, security and privacy concerns of performing an evaluation and management service virtually and the availability of in person appointments. I also discussed with the patient that there may be a patient responsible charge related to this service. The patient expressed understanding and agreed to proceed. Subjective:  Bianca Jenkins is a 34 y.o. G3P2002 at [redacted]w[redacted]d being seen today for ongoing prenatal care.  She is currently monitored for the following issues for this high-risk pregnancy and has Supervision of other high risk pregnancy, antepartum and Chronic hypertension on their problem list.  Patient reports no complaints.  Contractions: Not present. Vag. Bleeding: None.  Movement: Present. Denies any leaking of fluid.   The following portions of the patient's history were reviewed and updated as appropriate: allergies, current medications, past family history, past medical history, past social history, past surgical history and problem list.   Objective:  There were no vitals filed for this visit.  Fetal Status:     Movement: Present     General:  Alert, oriented and cooperative. Patient is in no acute distress.  Respiratory: Normal respiratory effort, no problems with respiration noted  Mental Status: Normal mood and affect. Normal behavior. Normal judgment and thought content.  Rest of physical exam deferred due to type of encounter  Imaging: [place] MFM OB DETAIL +14 WK  Result Date: 08/03/2019 ----------------------------------------------------------------------  OBSTETRICS REPORT                       (Signed Final 08/03/2019 09:42 pm)  ---------------------------------------------------------------------- Patient Info  ID #:       [id number]                          D.O.B.:  Aug 21, 1985 (33 yrs)  Name:       [name]                Visit Date: 08/03/2019 11:01 am ---------------------------------------------------------------------- Performed By  Attending:        Lin Landsman      Ref. Address:      [address]                    [address]                                                              [address]                                                              [place], [place]  [address]  Performed By:     Lenise Arena        [facility]  Referred By:      Brock Bad MD ---------------------------------------------------------------------- Orders  #  Description                           Code        Ordered By  1  [place] MFM OB DETAIL +14 WK               76811.01    Coral Ceo ----------------------------------------------------------------------  #  Order #                     Accession #                Episode #  1  [id number]                   [id number]                 [id number] ---------------------------------------------------------------------- Indications  Encounter for antenatal screening for           Z36.3  malformations (low risk panorama)  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, 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)  [redacted] weeks gestation of pregnancy                 Z3A.20  Obesity complicating pregnancy, second          O99.212  trimester (BMI 31)  Hypertension - Chronic/Pre-existing             O10.019  (labetalol) ---------------------------------------------------------------------- Vital Signs  Weight (lb): 162                               Height:        5'0"  BMI:         31.64  ---------------------------------------------------------------------- Fetal Evaluation  Num Of Fetuses:          1  Fetal Heart Rate(bpm):   148  Cardiac Activity:        Observed  Presentation:            Cephalic  Placenta:                Anterior  P. Cord Insertion:       Visualized, central  Amniotic Fluid  AFI FV:      Within normal limits                              Largest Pocket(cm)                              4.9 ---------------------------------------------------------------------- Biometry  BPD:      45.6  mm  G. Age:  19w 5d         29  %    CI:          71.3  %    70 - 86                                                          FL/HC:       18.1  %    16.8 - 19.8  HC:       172   mm     G. Age:  19w 5d         20  %    HC/AC:       1.19       1.09 - 1.39  AC:      144.6  mm     G. Age:  19w 5d         28  %    FL/BPD:      68.4  %  FL:       31.2  mm     G. Age:  19w 5d         22  %    FL/AC:       21.6  %    20 - 24  CER:      19.5  mm     G. Age:  18w 5d         10  %  NFT:       4.2  mm  LV:        4.4  mm  CM:        3.3  mm  Est. FW:     310   gm   0 lb 11 oz      19  % ---------------------------------------------------------------------- OB History  Gravidity:    3         Term:   2  Living:       2 ---------------------------------------------------------------------- Gestational Age  LMP:           20w 2d        Date:  03/14/19                 EDD:   12/19/19  U/S Today:     19w 5d                                        EDD:   12/23/19  Best:          Hyacinth Meeker 2d     Det. By:  LMP  (03/14/19)          EDD:   12/19/19 ---------------------------------------------------------------------- Anatomy  Cranium:               Appears normal         Aortic Arch:            Appears normal  Cavum:                 Appears normal         Ductal Arch:            Appears normal  Ventricles:  Appears normal         Diaphragm:              Appears normal  Choroid Plexus:        Appears normal          Stomach:                Appears normal, left                                                                        sided  Cerebellum:            Appears normal         Abdomen:                Appears normal  Posterior Fossa:       Appears normal         Abdominal Wall:         Appears nml (cord                                                                        insert, abd wall)  Nuchal Fold:           Appears normal         Cord Vessels:           Appears normal (3                                                                        vessel cord)  Face:                  Appears normal         Kidneys:                Appear normal                         (orbits and profile)  Lips:                  Appears normal         Bladder:                Appears normal  Thoracic:              Appears normal         Spine:                  Appears normal  Heart:                 Appears normal         Upper Extremities:      Appears normal                         (  4CH, axis, and                         situs)  RVOT:                  Appears normal         Lower Extremities:      Appears normal  LVOT:                  Appears normal  Other:  Female gender Heels and 5th digit visualized. Technically difficult due          to fetal position. ---------------------------------------------------------------------- Cervix Uterus Adnexa  Cervix  Length:           3.54  cm.  Normal appearance by transabdominal scan. ---------------------------------------------------------------------- Impression  Normal interval growth with measurements consistent with  dates  Good fetal movement and amniotic fluid volume  Chronic hypertension on antihypertension therapy. ---------------------------------------------------------------------- Recommendations  Follow up growth in 4 weeks. ----------------------------------------------------------------------               Lin Landsman, MD Electronically Signed Final Report   08/03/2019 09:42  pm ----------------------------------------------------------------------   Assessment and Plan:  Pregnancy: M8U1324 at [redacted]w[redacted]d 1. Supervision of other high risk pregnancy, antepartum  2. Chronic hypertension - taking Labetalol once a day because of chest discomfort taking it twice a day.  BP is 123/60  3. Trichomonas vaginitis - Flagyl Rx  Preterm labor symptoms and general obstetric precautions including but not limited to vaginal bleeding, contractions, leaking of fluid and fetal movement were reviewed in detail with the patient. I discussed the assessment and treatment plan with the patient. The patient was provided an opportunity to ask questions and all were answered. The patient agreed with the plan and demonstrated an understanding of the instructions. The patient was advised to call back or seek an in-person office evaluation/go to MAU at [facility] for any urgent or concerning symptoms. Please refer to After Visit Summary for other counseling recommendations.   I provided 10 minutes of face-to-face time during this encounter.  Return in about 4 weeks (around 09/06/2019) for Telephone OB.  Future Appointments  Date Time Provider Department Center  08/31/2019 11:30 AM [facility] NURSE [facility]  08/31/2019 11:30 AM [facility] US3 [facility]    Coral Ceo, MD Center for [facility] 08/09/2019

## 2019-08-29 ENCOUNTER — Emergency Department (HOSPITAL_COMMUNITY)
Admission: EM | Admit: 2019-08-29 | Discharge: 2019-08-30 | Disposition: A | Discharge status: Home / Self Care | Payer: Medicaid Other | Attending: Emergency Medicine | Admitting: Emergency Medicine

## 2019-08-29 ENCOUNTER — Other Ambulatory Visit: Payer: Self-pay

## 2019-08-29 DIAGNOSIS — K0889 Other specified disorders of teeth and supporting structures: Secondary | ICD-10-CM | POA: Diagnosis present

## 2019-08-29 DIAGNOSIS — Z5321 Procedure and treatment not carried out due to patient leaving prior to being seen by health care provider: Secondary | ICD-10-CM | POA: Insufficient documentation

## 2019-08-31 ENCOUNTER — Other Ambulatory Visit: Payer: Self-pay

## 2019-08-31 ENCOUNTER — Inpatient Hospital Stay (HOSPITAL_COMMUNITY)
Admission: AD | Admit: 2019-08-31 | Discharge: 2019-08-31 | Disposition: A | Discharge status: Home / Self Care | Payer: Medicaid Other | Attending: Obstetrics and Gynecology | Admitting: Obstetrics and Gynecology

## 2019-08-31 ENCOUNTER — Ambulatory Visit: Payer: Medicaid Other

## 2019-08-31 DIAGNOSIS — Z3A24 24 weeks gestation of pregnancy: Secondary | ICD-10-CM | POA: Diagnosis not present

## 2019-08-31 DIAGNOSIS — Z7901 Long term (current) use of anticoagulants: Secondary | ICD-10-CM | POA: Diagnosis not present

## 2019-08-31 DIAGNOSIS — K0889 Other specified disorders of teeth and supporting structures: Secondary | ICD-10-CM | POA: Diagnosis not present

## 2019-08-31 DIAGNOSIS — F17210 Nicotine dependence, cigarettes, uncomplicated: Secondary | ICD-10-CM | POA: Insufficient documentation

## 2019-08-31 DIAGNOSIS — O99332 Smoking (tobacco) complicating pregnancy, second trimester: Secondary | ICD-10-CM | POA: Insufficient documentation

## 2019-08-31 DIAGNOSIS — Z79899 Other long term (current) drug therapy: Secondary | ICD-10-CM | POA: Insufficient documentation

## 2019-08-31 DIAGNOSIS — O26892 Other specified pregnancy related conditions, second trimester: Secondary | ICD-10-CM | POA: Diagnosis present

## 2019-08-31 DIAGNOSIS — O10012 Pre-existing essential hypertension complicating pregnancy, second trimester: Secondary | ICD-10-CM | POA: Insufficient documentation

## 2019-08-31 MED ORDER — OXYCODONE-ACETAMINOPHEN 5-325 MG PO TABS: 1.0000 | ORAL_TABLET | Freq: Four times a day (QID) | ORAL | 0 refills | Status: DC | PRN

## 2019-08-31 MED ORDER — AMOXICILLIN-POT CLAVULANATE 875-125 MG PO TABS
1.0000 | ORAL_TABLET | Freq: Two times a day (BID) | ORAL | 0 refills | Status: DC
Start: 2019-08-31 — End: 2019-11-09

## 2019-08-31 NOTE — MAU Provider Note (Signed)
History     CSN: [id number]  Arrival date and time: 08/31/19 1158   First Provider Initiated Contact with Patient 08/31/19 1349      Chief Complaint  Patient presents with  . Dental Pain   Bianca Jenkins is a 34 y.o. G3P2002 at [redacted]w[redacted]d who presents today with dental pain. She denies any contractions, VB or  LOF. She reports normal fetal movement.   Dental Pain  This is a new problem. Episode onset: 4 days  The problem occurs constantly. The problem has been gradually worsening. The pain is at a severity of 10/10. Associated symptoms include facial pain and thermal sensitivity. Pertinent negatives include no fever or oral bleeding. She has tried acetaminophen (warm water washes, peroxide rinses) for the symptoms. The treatment provided no relief.    OB History    Gravida  3   Para  2   Term  2   Preterm  0   AB  0   Living  2     SAB  0   TAB  0   Ectopic  0   Multiple  0   Live Births  2           Past Medical History:  Diagnosis Date  . Hypertension     Past Surgical History:  Procedure Laterality Date  . NO PAST SURGERIES      Family History  Problem Relation Age of Onset  . Hypertension Mother   . Hypertension Maternal Grandmother     Social History   Tobacco Use  . Smoking status: Current Every Day Smoker    Packs/day: 0.10  . Smokeless tobacco: Never Used  Substance Use Topics  . Alcohol use: No  . Drug use: No    Allergies: No Known Allergies  Medications Prior to Admission  Medication Sig Dispense Refill Last Dose  . amLODipine (NORVASC) 5 MG tablet Take 1 tablet (5 mg total) by mouth daily. (Patient not taking: Reported on 07/12/2019) 30 tablet 3   . amoxicillin (AMOXIL) 500 MG capsule Take 1 capsule (500 mg total) by mouth 3 (three) times daily. (Patient not taking: Reported on 07/12/2019) 30 capsule 0   . labetalol (NORMODYNE) 200 MG tablet Take 1 tablet (200 mg total) by mouth 2 (two) times daily. 60 tablet 5   . metroNIDAZOLE  (FLAGYL) 500 MG tablet Take 1 tablet (500 mg total) by mouth 2 (two) times daily. (Patient not taking: Reported on 08/03/2019) 14 tablet 2   . Prenat w/o A-FeCbn-Meth-FA-DHA (PRENATE MINI) 29-0.6-0.4-350 MG CAPS Take 1 capsule by mouth daily before breakfast. 90 capsule 3   . terconazole (TERAZOL 7) 0.4 % vaginal cream Place 1 applicator vaginally at bedtime. (Patient not taking: Reported on 08/03/2019) 45 g 0   . traMADol (ULTRAM) 50 MG tablet Take 1 tablet (50 mg total) by mouth every 6 (six) hours as needed. (Patient not taking: Reported on 07/12/2019) 15 tablet 0     Review of Systems  Constitutional: Negative for fever.   Physical Exam   Blood pressure (!) 153/82, pulse 66, temperature 98.9 F (37.2 C), temperature source Oral, resp. rate 20, height 5' (1.524 m), weight 78 kg, last menstrual period 03/14/2019, SpO2 100 %.  Physical Exam  Nursing note and vitals reviewed. Constitutional: She is oriented to person, place, and time. She appears well-developed and well-nourished. No distress.  HENT:  Head: Normocephalic.  Mouth/Throat: Dental caries present.    Cardiovascular: Normal rate.  Respiratory: Effort normal.  GI: Soft. There is no abdominal tenderness. There is no rebound.  Neurological: She is alert and oriented to person, place, and time.  Skin: Skin is warm and dry.  Psychiatric: She has a normal mood and affect.   Missing tooth on the bottom left, and 3rd molar present on the left side as well.   +FHT 145 with doppler    MAU Course  Procedures  MDM   Assessment and Plan   1. Pain, dental   2. [redacted] weeks gestation of pregnancy    DC home Dental letter provided and patient advised to call dentist for appt. 2nd/3rd Trimester precautions  PTL precautions  Fetal kick counts RX: Augmentin BID x 7 days , oxycodone PRN #20  Return to MAU as needed FU with OB as planned  Follow-up Information    [facility] Follow up.   Contact information: [address] [place] [address] [phone]         Thressa Sheller DNP, CNM  08/31/19  6:45 PM     Thressa Sheller 08/31/2019, 1:50 PM

## 2019-08-31 NOTE — MAU Note (Addendum)
Mouth has been hurting for the last 4 days.  Hasn't slept last 2 nights.  Pain is on left side, upper and lower. [facility] to come here.   Has been taking Tylenol, no relief.  No ob complaints.

## 2019-08-31 NOTE — Discharge Instructions (Signed)
  08/31/2019  To Whom It May Concern:   Bianca Jenkins, DOB: 06-27-1985, is currently receiving prenatal care in our office.  We are referring her to you for dental treatment.  She may have all the usual dental procedures as if she were not pregnant. This includes shielded dental x-rays and local infiltration anesthesia with Novocaine with Epinephrine.  She may also have the usual narcotic analgesia and antibiotics as needed.   Sincerely,   Thressa Sheller DNP, CNM  08/31/19  2:03 PM  [facility]

## 2019-09-06 ENCOUNTER — Encounter: Payer: Self-pay | Admitting: Obstetrics

## 2019-09-06 ENCOUNTER — Telehealth (INDEPENDENT_AMBULATORY_CARE_PROVIDER_SITE_OTHER): Payer: Medicaid Other | Admitting: Obstetrics

## 2019-09-06 DIAGNOSIS — O09899 Supervision of other high risk pregnancies, unspecified trimester: Secondary | ICD-10-CM

## 2019-09-06 DIAGNOSIS — I10 Essential (primary) hypertension: Secondary | ICD-10-CM

## 2019-09-06 NOTE — Progress Notes (Signed)
Virtual ROB   CC:    

## 2019-09-07 ENCOUNTER — Other Ambulatory Visit: Payer: Self-pay | Admitting: *Deleted

## 2019-09-07 ENCOUNTER — Ambulatory Visit: Payer: Medicaid Other | Attending: Maternal & Fetal Medicine

## 2019-09-07 ENCOUNTER — Encounter: Payer: Self-pay | Admitting: Obstetrics

## 2019-09-07 ENCOUNTER — Other Ambulatory Visit: Payer: Self-pay

## 2019-09-07 ENCOUNTER — Telehealth (INDEPENDENT_AMBULATORY_CARE_PROVIDER_SITE_OTHER): Payer: Medicaid Other | Admitting: Obstetrics

## 2019-09-07 ENCOUNTER — Ambulatory Visit: Payer: Medicaid Other | Admitting: *Deleted

## 2019-09-07 VITALS — BP 122/69 | HR 64

## 2019-09-07 DIAGNOSIS — O99212 Obesity complicating pregnancy, second trimester: Secondary | ICD-10-CM | POA: Diagnosis not present

## 2019-09-07 DIAGNOSIS — O10012 Pre-existing essential hypertension complicating pregnancy, second trimester: Secondary | ICD-10-CM

## 2019-09-07 DIAGNOSIS — O321XX0 Maternal care for breech presentation, not applicable or unspecified: Secondary | ICD-10-CM

## 2019-09-07 DIAGNOSIS — E669 Obesity, unspecified: Secondary | ICD-10-CM

## 2019-09-07 DIAGNOSIS — I10 Essential (primary) hypertension: Secondary | ICD-10-CM

## 2019-09-07 DIAGNOSIS — O0990 Supervision of high risk pregnancy, unspecified, unspecified trimester: Secondary | ICD-10-CM | POA: Insufficient documentation

## 2019-09-07 DIAGNOSIS — O10919 Unspecified pre-existing hypertension complicating pregnancy, unspecified trimester: Secondary | ICD-10-CM | POA: Insufficient documentation

## 2019-09-07 DIAGNOSIS — O09899 Supervision of other high risk pregnancies, unspecified trimester: Secondary | ICD-10-CM

## 2019-09-07 DIAGNOSIS — Z363 Encounter for antenatal screening for malformations: Secondary | ICD-10-CM | POA: Diagnosis not present

## 2019-09-07 DIAGNOSIS — Z3A25 25 weeks gestation of pregnancy: Secondary | ICD-10-CM

## 2019-09-07 DIAGNOSIS — O99210 Obesity complicating pregnancy, unspecified trimester: Secondary | ICD-10-CM

## 2019-09-07 DIAGNOSIS — O09893 Supervision of other high risk pregnancies, third trimester: Secondary | ICD-10-CM

## 2019-09-07 NOTE — Progress Notes (Signed)
OBSTETRICS PRENATAL VIRTUAL VISIT ENCOUNTER NOTE  Provider location: [facility] [place] [facility] [place]   I connected with Bianca Jenkins on 09/07/19 at  1:30 PM EDT by MyChart Video Encounter at home and verified that I am speaking with the correct person using two identifiers.   I discussed the limitations, risks, security and privacy concerns of performing an evaluation and management service virtually and the availability of in person appointments. I also discussed with the patient that there may be a patient responsible charge related to this service. The patient expressed understanding and agreed to proceed.  Subjective:  Bianca Jenkins is a 34 y.o. G3P2002 at [redacted]w[redacted]d being seen today for ongoing prenatal care.  She is currently monitored for the following issues for this high-risk pregnancy and has Supervision of other high risk pregnancy, antepartum and Chronic hypertension on their problem list.  Patient reports heartburn.  Contractions: Not present. Vag. Bleeding: None.  Movement: Present. Denies any leaking of fluid.   The following portions of the patient's history were reviewed and updated as appropriate: allergies, current medications, past family history, past medical history, past social history, past surgical history and problem list.   Objective:  There were no vitals filed for this visit.  Fetal Status:     Movement: Present     General:  Alert, oriented and cooperative. Patient is in no acute distress.  Respiratory: Normal respiratory effort, no problems with respiration noted  Mental Status: Normal mood and affect. Normal behavior. Normal judgment and thought content.  Rest of physical exam deferred due to type of encounter  Imaging: [place] MFM OB FOLLOW UP  Result Date: 09/07/2019 ----------------------------------------------------------------------  OBSTETRICS REPORT                       (Signed Final 09/07/2019 10:50 am)  ---------------------------------------------------------------------- Patient Info  ID #:       [id number]                          D.O.B.:  10/24/1985 (33 yrs)  Name:       Bianca Jenkins                Visit Date: 09/07/2019 08:27 am ---------------------------------------------------------------------- Performed By  Attending:        Tama High MD        Ref. Address:     [place], [place]  [address]  Performed By:     Birdena Crandall        [facility]                                 Fetal Care  Referred By:      Brock Bad MD ---------------------------------------------------------------------- Orders  #  Description                           Code        Ordered By  1  [place] MFM OB FOLLOW UP                   60454.09    Lin Landsman ----------------------------------------------------------------------  #  Order #                     Accession #                Episode #  1  [id number]                   [id number]                 [id number] ---------------------------------------------------------------------- Indications  Encounter for antenatal screening for          Z36.3  malformations (low risk panorama)  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,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)  Obesity complicating pregnancy, second         O99.212  trimester (BMI 31)  Hypertension - Chronic/Pre-existing            O10.019  (labetalol)  [redacted] weeks gestation of pregnancy                Z3A.25 ---------------------------------------------------------------------- Vital Signs                                                 Height:        5'0"  ---------------------------------------------------------------------- Fetal Evaluation  Num Of Fetuses:         1  Fetal Heart Rate(bpm):  147  Cardiac Activity:       Observed  Presentation:           Breech  Placenta:               Anterior  P. Cord Insertion:      Previously Visualized  Amniotic Fluid  AFI FV:      Within normal limits                              Largest  Pocket(cm)                              4.4 ---------------------------------------------------------------------- Biometry  BPD:      58.9  mm     G. Age:  24w 1d          9  %    CI:        65.99   %    70 - 86                                                          FL/HC:      20.1   %    18.7 - 20.3  HC:      232.6  mm     G. Age:  25w 2d         28  %    HC/AC:      1.16        1.04 - 1.22  AC:       200   mm     G. Age:  24w 4d         22  %    FL/BPD:     79.5   %    71 - 87  FL:       46.8  mm     G. Age:  25w 4d         46  %    FL/AC:      23.4   %    20 - 24  Est. FW:     761  gm    1 lb 11 oz      28  % ---------------------------------------------------------------------- OB History  Gravidity:    3         Term:   2  Living:       2 ---------------------------------------------------------------------- Gestational Age  LMP:           25w 2d        Date:  03/14/19                 EDD:   12/19/19  U/S Today:     24w 6d                                        EDD:   12/22/19  Best:          25w 2d     Det. By:  LMP  (03/14/19)          EDD:   12/19/19 ---------------------------------------------------------------------- Anatomy  Cranium:               Appears normal         LVOT:                   Appears normal  Cavum:                 Appears normal         Aortic Arch:            Previously seen  Ventricles:  Appears normal         Ductal Arch:            Appears normal  Choroid Plexus:        Appears normal         Diaphragm:              Appears normal  Cerebellum:            Appears normal         Stomach:                 Appears normal, left                                                                        sided  Posterior Fossa:       Appears normal         Abdomen:                Appears normal  Nuchal Fold:           Previously seen        Abdominal Wall:         Appears nml (cord                                                                        insert, abd wall)  Face:                  Orbits and profile     Cord Vessels:           Appears normal (3                         previously seen                                vessel cord)  Lips:                  Previously seen        Kidneys:                Appear normal  Palate:                Appears normal         Bladder:                Appears normal  Thoracic:              Appears normal         Spine:                  Previously seen  Heart:                 Appears normal         Upper Extremities:      Previously seen                         (  4CH, axis, and                         situs)  RVOT:                  Appears normal         Lower Extremities:      Previously seen  Other:  Female gender,  Heels and 5th digit previously  visualized.          Technically difficult due to fetal position. ---------------------------------------------------------------------- Impression  Chronic hypertension. Well-controlled on labetalol .  Fetal growth is appropriate for gestational age .Amniotic fluid  is normal and good fetal activity is seen .  BP at our office: 122/69 mm Hg . ----------------------------------------------------------------------                  Noralee Space, MD Electronically Signed Final Report   09/07/2019 10:50 am ----------------------------------------------------------------------   Assessment and Plan:  Pregnancy: W1U9323 at [redacted]w[redacted]d 1. Supervision of other high risk pregnancy, antepartum  2. Chronic hypertension - clinically stable  3. Obesity affecting pregnancy, antepartum   Preterm labor symptoms and general obstetric precautions  including but not limited to vaginal bleeding, contractions, leaking of fluid and fetal movement were reviewed in detail with the patient. I discussed the assessment and treatment plan with the patient. The patient was provided an opportunity to ask questions and all were answered. The patient agreed with the plan and demonstrated an understanding of the instructions. The patient was advised to call back or seek an in-person office evaluation/go to MAU at [facility] for any urgent or concerning symptoms. Please refer to After Visit Summary for other counseling recommendations.   I provided 10 minutes of face-to-face time during this encounter.  Return in about 3 weeks (around 09/28/2019) for ROB, 2 hour OGTT.  Future Appointments  Date Time Provider Department Center  10/05/2019 11:30 AM [facility] US3 [facility]    Coral Ceo, MD Center for [facility] 09/07/19

## 2019-09-07 NOTE — Progress Notes (Signed)
Virtual OB  CC: None

## 2019-09-28 ENCOUNTER — Encounter: Payer: Medicaid Other | Admitting: Obstetrics and Gynecology

## 2019-09-28 ENCOUNTER — Other Ambulatory Visit: Payer: Medicaid Other

## 2019-09-30 ENCOUNTER — Encounter: Payer: Medicaid Other | Admitting: Obstetrics

## 2019-09-30 ENCOUNTER — Other Ambulatory Visit: Payer: Medicaid Other

## 2019-10-04 ENCOUNTER — Ambulatory Visit (INDEPENDENT_AMBULATORY_CARE_PROVIDER_SITE_OTHER): Payer: Medicaid Other | Admitting: Obstetrics

## 2019-10-04 ENCOUNTER — Other Ambulatory Visit: Payer: Medicaid Other

## 2019-10-04 ENCOUNTER — Other Ambulatory Visit: Payer: Self-pay

## 2019-10-04 ENCOUNTER — Encounter: Payer: Self-pay | Admitting: Obstetrics

## 2019-10-04 VITALS — BP 119/74 | HR 77 | Wt 175.5 lb

## 2019-10-04 DIAGNOSIS — O09899 Supervision of other high risk pregnancies, unspecified trimester: Secondary | ICD-10-CM

## 2019-10-04 DIAGNOSIS — O99210 Obesity complicating pregnancy, unspecified trimester: Secondary | ICD-10-CM

## 2019-10-04 DIAGNOSIS — Z3A29 29 weeks gestation of pregnancy: Secondary | ICD-10-CM

## 2019-10-04 DIAGNOSIS — O26893 Other specified pregnancy related conditions, third trimester: Secondary | ICD-10-CM

## 2019-10-04 DIAGNOSIS — O09893 Supervision of other high risk pregnancies, third trimester: Secondary | ICD-10-CM

## 2019-10-04 DIAGNOSIS — I10 Essential (primary) hypertension: Secondary | ICD-10-CM

## 2019-10-04 DIAGNOSIS — M549 Dorsalgia, unspecified: Secondary | ICD-10-CM

## 2019-10-04 DIAGNOSIS — E669 Obesity, unspecified: Secondary | ICD-10-CM

## 2019-10-04 DIAGNOSIS — O10013 Pre-existing essential hypertension complicating pregnancy, third trimester: Secondary | ICD-10-CM

## 2019-10-04 MED ORDER — COMFORT FIT MATERNITY SUPP SM MISC: 0 refills | Status: DC

## 2019-10-04 NOTE — Progress Notes (Signed)
Patient reports fetal movement, denies pain. Pt reports eating a burger and fries at 3am, GTT will be rescheduled.

## 2019-10-04 NOTE — Progress Notes (Signed)
Subjective:  Bianca Jenkins is a 34 y.o. G3P2002 at [redacted]w[redacted]d being seen today for ongoing prenatal care.  She is currently monitored for the following issues for this high-risk pregnancy and has Supervision of other high risk pregnancy, antepartum and Chronic hypertension on their problem list.  Patient reports backache and heartburn.  Contractions: Not present. Vag. Bleeding: None.  Movement: Present. Denies leaking of fluid.   The following portions of the patient's history were reviewed and updated as appropriate: allergies, current medications, past family history, past medical history, past social history, past surgical history and problem list. Problem list updated.  Objective:   Vitals:   10/04/19 0822  BP: 119/74  Pulse: 77  Weight: 175 lb 8 oz (79.6 kg)    Fetal Status:     Movement: Present     General:  Alert, oriented and cooperative. Patient is in no acute distress.  Skin: Skin is warm and dry. No rash noted.   Cardiovascular: Normal heart rate noted  Respiratory: Normal respiratory effort, no problems with respiration noted  Abdomen: Soft, gravid, appropriate for gestational age. Pain/Pressure: Absent     Pelvic:  Cervical exam deferred        Extremities: Normal range of motion.  Edema: None  Mental Status: Normal mood and affect. Normal behavior. Normal judgment and thought content.   Urinalysis:      Assessment and Plan:  Pregnancy: G3P2002 at [redacted]w[redacted]d  1. Supervision of other high risk pregnancy, antepartum  2. Chronic hypertension - clinically stable  3. Obesity affecting pregnancy, antepartum   Preterm labor symptoms and general obstetric precautions including but not limited to vaginal bleeding, contractions, leaking of fluid and fetal movement were reviewed in detail with the patient. Please refer to After Visit Summary for other counseling recommendations.   Return in about 2 weeks (around 10/18/2019) for MyChart HOB-Faculty Only.   Brock Bad,  MD  10/04/19

## 2019-10-05 ENCOUNTER — Ambulatory Visit: Payer: Medicaid Other

## 2019-10-07 ENCOUNTER — Other Ambulatory Visit: Payer: Medicaid Other

## 2019-10-11 ENCOUNTER — Other Ambulatory Visit: Payer: Self-pay | Admitting: Obstetrics

## 2019-10-11 ENCOUNTER — Other Ambulatory Visit: Payer: Self-pay

## 2019-10-11 ENCOUNTER — Other Ambulatory Visit: Payer: Medicaid Other

## 2019-10-11 DIAGNOSIS — O09899 Supervision of other high risk pregnancies, unspecified trimester: Secondary | ICD-10-CM

## 2019-10-12 ENCOUNTER — Other Ambulatory Visit: Payer: Self-pay | Admitting: Obstetrics

## 2019-10-12 DIAGNOSIS — O99019 Anemia complicating pregnancy, unspecified trimester: Secondary | ICD-10-CM

## 2019-10-12 LAB — CBC
Hematocrit: 32.1 % — ABNORMAL LOW (ref 34.0–46.6)
Hemoglobin: 10.4 g/dL — ABNORMAL LOW (ref 11.1–15.9)
MCH: 27.9 pg (ref 26.6–33.0)
MCHC: 32.4 g/dL (ref 31.5–35.7)
MCV: 86 fL (ref 79–97)
Platelets: 243 10*3/uL (ref 150–450)
RBC: 3.73 x10E6/uL — ABNORMAL LOW (ref 3.77–5.28)
RDW: 14.1 % (ref 11.7–15.4)
WBC: 10.3 10*3/uL (ref 3.4–10.8)

## 2019-10-12 LAB — HIV ANTIBODY (ROUTINE TESTING W REFLEX): HIV Screen 4th Generation wRfx: NONREACTIVE

## 2019-10-12 LAB — GLUCOSE TOLERANCE, 2 HOURS W/ 1HR
Glucose, 1 hour: 119 mg/dL (ref 65–179)
Glucose, 2 hour: 82 mg/dL (ref 65–152)
Glucose, Fasting: 73 mg/dL (ref 65–91)

## 2019-10-12 LAB — RPR: RPR Ser Ql: NONREACTIVE

## 2019-10-12 MED ORDER — FERROUS SULFATE 325 (65 FE) MG PO TABS: 325.0000 mg | ORAL_TABLET | Freq: Two times a day (BID) | ORAL | 5 refills | Status: DC

## 2019-10-13 ENCOUNTER — Ambulatory Visit: Payer: Medicaid Other | Attending: Obstetrics and Gynecology

## 2019-10-13 ENCOUNTER — Other Ambulatory Visit: Payer: Self-pay | Admitting: *Deleted

## 2019-10-13 ENCOUNTER — Other Ambulatory Visit: Payer: Self-pay

## 2019-10-13 ENCOUNTER — Ambulatory Visit: Payer: Medicaid Other | Admitting: *Deleted

## 2019-10-13 VITALS — BP 117/57 | HR 73

## 2019-10-13 DIAGNOSIS — O10013 Pre-existing essential hypertension complicating pregnancy, third trimester: Secondary | ICD-10-CM

## 2019-10-13 DIAGNOSIS — E669 Obesity, unspecified: Secondary | ICD-10-CM

## 2019-10-13 DIAGNOSIS — Z362 Encounter for other antenatal screening follow-up: Secondary | ICD-10-CM

## 2019-10-13 DIAGNOSIS — O10919 Unspecified pre-existing hypertension complicating pregnancy, unspecified trimester: Secondary | ICD-10-CM

## 2019-10-13 DIAGNOSIS — Z3A30 30 weeks gestation of pregnancy: Secondary | ICD-10-CM

## 2019-10-13 DIAGNOSIS — O99213 Obesity complicating pregnancy, third trimester: Secondary | ICD-10-CM

## 2019-10-13 DIAGNOSIS — Z148 Genetic carrier of other disease: Secondary | ICD-10-CM

## 2019-10-18 ENCOUNTER — Telehealth (INDEPENDENT_AMBULATORY_CARE_PROVIDER_SITE_OTHER): Payer: Medicaid Other | Admitting: Obstetrics and Gynecology

## 2019-10-18 ENCOUNTER — Encounter: Payer: Self-pay | Admitting: Obstetrics and Gynecology

## 2019-10-18 DIAGNOSIS — O09899 Supervision of other high risk pregnancies, unspecified trimester: Secondary | ICD-10-CM

## 2019-10-18 DIAGNOSIS — I10 Essential (primary) hypertension: Secondary | ICD-10-CM

## 2019-10-18 DIAGNOSIS — Z3A31 31 weeks gestation of pregnancy: Secondary | ICD-10-CM

## 2019-10-18 DIAGNOSIS — O10913 Unspecified pre-existing hypertension complicating pregnancy, third trimester: Secondary | ICD-10-CM

## 2019-10-18 NOTE — Progress Notes (Signed)
OBSTETRICS PRENATAL VIRTUAL VISIT ENCOUNTER NOTE  Provider location: Center for [facility] [place]   I connected with Bianca Jenkins on 10/18/19 at  1:15 PM EDT by MyChart Video Encounter at home and verified that I am speaking with the correct person using two identifiers.   I discussed the limitations, risks, security and privacy concerns of performing an evaluation and management service virtually and the availability of in person appointments. I also discussed with the patient that there may be a patient responsible charge related to this service. The patient expressed understanding and agreed to proceed. Subjective:  Bianca Jenkins is a 34 y.o. G3P2002 at [redacted]w[redacted]d being seen today for ongoing prenatal care.  She is currently monitored for the following issues for this high-risk pregnancy and has Supervision of other high risk pregnancy, antepartum and Chronic hypertension on their problem list.  Patient reports no complaints.  Contractions: Not present. Vag. Bleeding: None.  Movement: Present. Denies any leaking of fluid.   The following portions of the patient's history were reviewed and updated as appropriate: allergies, current medications, past family history, past medical history, past social history, past surgical history and problem list.   Objective:  There were no vitals filed for this visit.  Fetal Status:     Movement: Present     General:  Alert, oriented and cooperative. Patient is in no acute distress.  Respiratory: Normal respiratory effort, no problems with respiration noted  Mental Status: Normal mood and affect. Normal behavior. Normal judgment and thought content.  Rest of physical exam deferred due to type of encounter  Imaging: [place] MFM OB FOLLOW UP  Result Date: 10/14/2019 ----------------------------------------------------------------------  OBSTETRICS REPORT                        (Signed Final 10/14/2019 08:03 am)  ---------------------------------------------------------------------- Patient Info  ID #:       [id number]                          D.O.B.:  11-02-1985 (33 yrs)  Name:       Bianca Jenkins                Visit Date: 10/13/2019 02:42 pm ---------------------------------------------------------------------- Performed By  Attending:        Lin Landsman      Ref. Address:      [address]                    [address]                                                              [address]                                                              [place], [place]  [id number]  Performed By:     Emeline DarlingKasie E Kiser BS,      [facility]  Referred By:      Brock BadHARLES A                    HARPER MD ---------------------------------------------------------------------- Orders  #  Description                           Code        Ordered By  1  US MFM OB FOLLOW UP                   [id number]    [facility] ----------------------------------------------------------------------  #  Order #                     Accession #                Episode #  1  [id number]                   [id number]                 [id number] ---------------------------------------------------------------------- Indications  Hypertension - Chronic/Pre-existing             O10.019  (labetalol)  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, 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)  Obesity complicating pregnancy, second          O99.212  trimester (BMI 31)  Low Risk NIPS  [redacted] weeks gestation of pregnancy                 Z3A.30  Encounter for other antenatal screening         Z36.2  follow-up ---------------------------------------------------------------------- Vital Signs                                                  Height:        5'0" ---------------------------------------------------------------------- Fetal Evaluation  Num Of Fetuses:          1  Fetal Heart Rate(bpm):   157  Cardiac Activity:        Observed  Presentation:            Cephalic  Placenta:                Anterior  P. Cord Insertion:       Previously Visualized  Amniotic Fluid  AFI FV:      Within normal limits  AFI Sum(cm)     %Tile       Largest Pocket(cm)  13.7            44          4.6  RUQ(cm)       RLQ(cm)       LUQ(cm)        LLQ(cm)  2.9           2.8           4.6            3.4 ---------------------------------------------------------------------- Biometry  BPD:      73.2  mm     G. Age:  29w 3d         12  %    CI:        70.47   %    70 - 86                                                          FL/HC:       20.9  %    19.2 - 21.4  HC:       278   mm     G. Age:  30w 3d         16  %    HC/AC:       1.10       0.99 - 1.21  AC:      252.6  mm     G. Age:  29w 3d         19  %    FL/BPD:      79.4  %    71 - 87  FL:       58.1  mm     G. Age:  30w 3d         34  %    FL/AC:       23.0  %    20 - 24  Est. FW:    1465   gm     3 lb 4 oz     20  % ---------------------------------------------------------------------- OB History  Gravidity:    3         Term:   2  Living:       2 ---------------------------------------------------------------------- Gestational Age  LMP:           30w 3d        Date:  03/14/19                 EDD:   12/19/19  U/S Today:     30w 0d                                        EDD:   12/22/19  Best:          Georgiann Hahn 3d  Det. By:  LMP  (03/14/19)          EDD:   12/19/19 ---------------------------------------------------------------------- Anatomy  Cranium:               Appears normal         LVOT:                   Previously seen  Cavum:                 Previously seen        Aortic Arch:            Previously seen  Ventricles:            Previously seen        Ductal Arch:             Previously seen  Choroid Plexus:        Previously seen        Diaphragm:              Appears normal  Cerebellum:            Previously seen        Stomach:                Appears normal, left                                                                        sided  Posterior Fossa:       Previously seen        Abdomen:                Appears normal  Nuchal Fold:           Previously seen        Abdominal Wall:         Previously seen  Face:                  Orbits and profile     Cord Vessels:           Previously seen                         previously seen  Lips:                  Previously seen        Kidneys:                Previously seen  Palate:                Appears normal         Bladder:                Appears normal  Thoracic:              Appears normal         Spine:                  Previously seen  Heart:                 Appears normal         Upper Extremities:  Previously seen                         (4CH, axis, and                         situs)  RVOT:                  Previously seen        Lower Extremities:      Previously seen  Other:  Female gender,  Heels and 5th digit previously  visualized.          Technically difficult due to fetal position. ---------------------------------------------------------------------- Cervix Uterus Adnexa  Cervix  Not visualized (advanced GA >24wks) ---------------------------------------------------------------------- Impression  Single intrauterine pregnancy here for a follow up growth due  to chronic hypertension  Normal interval growth with measurments consistent with  dates.  There is good fetal movement and amniotic fluid volume  Low risk NIPS ---------------------------------------------------------------------- Recommendations  Follow up growth in 4 weeks  Initiate weekly testing at 32 weeks. ----------------------------------------------------------------------               Lin Landsman, MD Electronically Signed Final Report    10/14/2019 08:03 am ----------------------------------------------------------------------   Assessment and Plan:  Pregnancy: [id number] at [redacted]w[redacted]d 1. Supervision of other high risk pregnancy, antepartum Patient is doing well without complaints Patient does plan on using contraception Patient plans to change pediatrician- list provided  2. Chronic hypertension Continue labetalol  Patient does not plan on taking ASA Follow up growth ultrasound Weekly BPP scheduled with MFM  Preterm labor symptoms and general obstetric precautions including but not limited to vaginal bleeding, contractions, leaking of fluid and fetal movement were reviewed in detail with the patient. I discussed the assessment and treatment plan with the patient. The patient was provided an opportunity to ask questions and all were answered. The patient agreed with the plan and demonstrated an understanding of the instructions. The patient was advised to call back or seek an in-person office evaluation/go to [facility] at [facility] for any urgent or concerning symptoms. Please refer to After Visit Summary for other counseling recommendations.   I provided 15 minutes of face-to-face time during this encounter.  Return in about 2 weeks (around 11/01/2019) for in person, ROB, High risk.  Future Appointments  Date Time Provider Department Center  10/27/2019  1:30 PM [facility]URSE [facility]  10/27/2019  1:45 PM [facility] US4 [facility]  11/03/2019  1:30 PM [facility] NURSE [facility]  11/03/2019  1:45 PM [facility] US5 [facility]  11/10/2019  1:30 PM [facility] NURSE [facility]  11/10/2019  1:45 PM [facility] US4 [facility]    Catalina Antigua, MD Center for [employer], [facility]

## 2019-10-18 NOTE — Patient Instructions (Signed)
[facility] [address] [place] [phone]  [facility] [address] [place] [phone] [facility] [address] [phone]  [facility] Main: [address] [phone] West: [address] [phone]  [facility] [address], [place] [phone]  [facility] [address], [place] [phone]  [facility] [address], [place] [phone]  [facility] [place]: [address] [phone] [place]: [address] [phone] [place]: [address], [address] [phone] [place]: [address] [phone]  [facility] [address], [place] [phone]  [facility] [address], [address] [phone]  [facility] [address] [phone]  [facility] [address], [place] [phone]  [facility] [address], [place] [phone]  [facility] [address], [place] [phone]  [facility] [address], [place] [phone]  [facility] @ [place]: [address], [place] [phone] FM @ [place]: [address], [place] [phone] Peds @ [address]: [address], [place] [phone] Peds @ [place]: [address], [place] [phone]  [facility] [address], [place] [phone]  [facility] [address], [place] [phone]  Private Pediatrician Jack Amos, MD [address], [place] [phone] Shilpa Gosrani, MD [address], [place] [phone] David Rubin, MD [address] [phone] [place]na[place], MD ([facility]) [address],  [phone]        

## 2019-10-27 ENCOUNTER — Ambulatory Visit: Payer: Medicaid Other | Admitting: *Deleted

## 2019-10-27 ENCOUNTER — Ambulatory Visit: Payer: Medicaid Other | Attending: Obstetrics and Gynecology

## 2019-10-27 ENCOUNTER — Other Ambulatory Visit: Payer: Self-pay

## 2019-10-27 VITALS — BP 130/70 | HR 62

## 2019-10-27 DIAGNOSIS — Z362 Encounter for other antenatal screening follow-up: Secondary | ICD-10-CM

## 2019-10-27 DIAGNOSIS — O99213 Obesity complicating pregnancy, third trimester: Secondary | ICD-10-CM | POA: Diagnosis not present

## 2019-10-27 DIAGNOSIS — O10919 Unspecified pre-existing hypertension complicating pregnancy, unspecified trimester: Secondary | ICD-10-CM

## 2019-10-27 DIAGNOSIS — Z148 Genetic carrier of other disease: Secondary | ICD-10-CM

## 2019-10-27 DIAGNOSIS — O10019 Pre-existing essential hypertension complicating pregnancy, unspecified trimester: Secondary | ICD-10-CM | POA: Diagnosis not present

## 2019-10-27 DIAGNOSIS — Z3A32 32 weeks gestation of pregnancy: Secondary | ICD-10-CM

## 2019-11-01 ENCOUNTER — Encounter: Payer: Medicaid Other | Admitting: Obstetrics & Gynecology

## 2019-11-03 ENCOUNTER — Ambulatory Visit: Payer: Medicaid Other | Admitting: *Deleted

## 2019-11-03 ENCOUNTER — Other Ambulatory Visit: Payer: Self-pay | Admitting: *Deleted

## 2019-11-03 ENCOUNTER — Other Ambulatory Visit: Payer: Self-pay | Admitting: Maternal & Fetal Medicine

## 2019-11-03 ENCOUNTER — Ambulatory Visit: Payer: Medicaid Other | Attending: Obstetrics and Gynecology

## 2019-11-03 ENCOUNTER — Other Ambulatory Visit: Payer: Self-pay

## 2019-11-03 VITALS — BP 114/56 | HR 68

## 2019-11-03 DIAGNOSIS — Z3A33 33 weeks gestation of pregnancy: Secondary | ICD-10-CM

## 2019-11-03 DIAGNOSIS — O10919 Unspecified pre-existing hypertension complicating pregnancy, unspecified trimester: Secondary | ICD-10-CM

## 2019-11-03 DIAGNOSIS — E669 Obesity, unspecified: Secondary | ICD-10-CM

## 2019-11-03 DIAGNOSIS — O99213 Obesity complicating pregnancy, third trimester: Secondary | ICD-10-CM

## 2019-11-03 DIAGNOSIS — O10013 Pre-existing essential hypertension complicating pregnancy, third trimester: Secondary | ICD-10-CM | POA: Diagnosis not present

## 2019-11-03 DIAGNOSIS — O365930 Maternal care for other known or suspected poor fetal growth, third trimester, not applicable or unspecified: Secondary | ICD-10-CM

## 2019-11-03 DIAGNOSIS — Z362 Encounter for other antenatal screening follow-up: Secondary | ICD-10-CM

## 2019-11-03 DIAGNOSIS — O365990 Maternal care for other known or suspected poor fetal growth, unspecified trimester, not applicable or unspecified: Secondary | ICD-10-CM

## 2019-11-03 DIAGNOSIS — Z148 Genetic carrier of other disease: Secondary | ICD-10-CM

## 2019-11-09 ENCOUNTER — Other Ambulatory Visit: Payer: Self-pay

## 2019-11-09 ENCOUNTER — Ambulatory Visit (INDEPENDENT_AMBULATORY_CARE_PROVIDER_SITE_OTHER): Payer: Medicaid Other | Admitting: Family Medicine

## 2019-11-09 ENCOUNTER — Encounter: Payer: Self-pay | Admitting: Family Medicine

## 2019-11-09 VITALS — BP 122/64 | HR 66 | Wt 181.7 lb

## 2019-11-09 DIAGNOSIS — I10 Essential (primary) hypertension: Secondary | ICD-10-CM

## 2019-11-09 DIAGNOSIS — O365990 Maternal care for other known or suspected poor fetal growth, unspecified trimester, not applicable or unspecified: Secondary | ICD-10-CM

## 2019-11-09 DIAGNOSIS — Z3A34 34 weeks gestation of pregnancy: Secondary | ICD-10-CM

## 2019-11-09 DIAGNOSIS — O09899 Supervision of other high risk pregnancies, unspecified trimester: Secondary | ICD-10-CM

## 2019-11-09 NOTE — Patient Instructions (Signed)

## 2019-11-09 NOTE — Progress Notes (Signed)
    PRENATAL VISIT NOTE  Subjective:  Bianca Jenkins is a 34 y.o. G3P2002 at [redacted]w[redacted]d being seen today for ongoing prenatal care.  She is currently monitored for the following issues for this high-risk pregnancy and has Supervision of other high risk pregnancy, antepartum; Chronic hypertension; and Pregnancy affected by fetal growth restriction on their problem list.  Patient reports no complaints.  Contractions: Not present. Vag. Bleeding: None.  Movement: Present. Denies leaking of fluid.   The following portions of the patient's history were reviewed and updated as appropriate: allergies, current medications, past family history, past medical history, past social history, past surgical history and problem list.   Objective:   Vitals:   11/09/19 1606  BP: 122/64  Pulse: 66  Weight: 181 lb 11.2 oz (82.4 kg)    Fetal Status: Fetal Heart Rate (bpm): 153   Movement: Present     General:  Alert, oriented and cooperative. Patient is in no acute distress.  Skin: Skin is warm and dry. No rash noted.   Cardiovascular: Normal heart rate noted  Respiratory: Normal respiratory effort, no problems with respiration noted  Abdomen: Soft, gravid, appropriate for gestational age.  Pain/Pressure: Present     Pelvic: Cervical exam deferred        Extremities: Normal range of motion.  Edema: None  Mental Status: Normal mood and affect. Normal behavior. Normal judgment and thought content.   Assessment and Plan:  Pregnancy: G3P2002 at [redacted]w[redacted]d 1. [redacted] weeks gestation of pregnancy   2. Supervision of other high risk pregnancy, antepartum Continue  prenatal care. Cultures next vsiit   3. Chronic hypertension BP is well controlled on labetalol   4. Pregnancy affected by fetal growth restriction For weekly testing with MFM  Preterm labor symptoms and general obstetric precautions including but not limited to vaginal bleeding, contractions, leaking of fluid and fetal movement were reviewed in detail  with the patient. Please refer to After Visit Summary for other counseling recommendations.   Return in 2 weeks (on 11/23/2019) for in person, [facility].  Future Appointments  Date Time Provider Department Center  11/10/2019  1:30 PM [facility] NURSE [facility]  11/10/2019  1:45 PM [facility] US4 [facility]  11/17/2019  3:15 PM [facility] NURSE [facility]  11/17/2019  3:30 PM [facility] US3 [facility]  11/23/2019  3:30 PM Johnny Bridge, MD [facility] None  11/24/2019  3:00 PM [facility] NURSE [facility]  11/24/2019  3:15 PM [facility] US2 [facility]    Reva Bores, MD

## 2019-11-09 NOTE — Progress Notes (Signed)
Pt presents for routine OB visit BPP scheduled tomorrow Pt unable to void

## 2019-11-10 ENCOUNTER — Ambulatory Visit: Payer: Medicaid Other | Attending: Obstetrics and Gynecology

## 2019-11-10 ENCOUNTER — Ambulatory Visit: Payer: Medicaid Other | Admitting: *Deleted

## 2019-11-10 DIAGNOSIS — Z362 Encounter for other antenatal screening follow-up: Secondary | ICD-10-CM | POA: Diagnosis not present

## 2019-11-10 DIAGNOSIS — O10013 Pre-existing essential hypertension complicating pregnancy, third trimester: Secondary | ICD-10-CM | POA: Diagnosis not present

## 2019-11-10 DIAGNOSIS — O365930 Maternal care for other known or suspected poor fetal growth, third trimester, not applicable or unspecified: Secondary | ICD-10-CM

## 2019-11-10 DIAGNOSIS — O365990 Maternal care for other known or suspected poor fetal growth, unspecified trimester, not applicable or unspecified: Secondary | ICD-10-CM | POA: Diagnosis present

## 2019-11-10 DIAGNOSIS — Z148 Genetic carrier of other disease: Secondary | ICD-10-CM

## 2019-11-10 DIAGNOSIS — O10919 Unspecified pre-existing hypertension complicating pregnancy, unspecified trimester: Secondary | ICD-10-CM | POA: Diagnosis not present

## 2019-11-10 DIAGNOSIS — O99213 Obesity complicating pregnancy, third trimester: Secondary | ICD-10-CM

## 2019-11-10 DIAGNOSIS — E669 Obesity, unspecified: Secondary | ICD-10-CM

## 2019-11-10 DIAGNOSIS — Z3A34 34 weeks gestation of pregnancy: Secondary | ICD-10-CM

## 2019-11-17 ENCOUNTER — Ambulatory Visit: Payer: Medicaid Other | Attending: Obstetrics and Gynecology

## 2019-11-17 ENCOUNTER — Ambulatory Visit: Payer: Medicaid Other | Admitting: *Deleted

## 2019-11-17 ENCOUNTER — Other Ambulatory Visit: Payer: Self-pay

## 2019-11-17 DIAGNOSIS — E669 Obesity, unspecified: Secondary | ICD-10-CM | POA: Diagnosis not present

## 2019-11-17 DIAGNOSIS — O10013 Pre-existing essential hypertension complicating pregnancy, third trimester: Secondary | ICD-10-CM | POA: Diagnosis not present

## 2019-11-17 DIAGNOSIS — I10 Essential (primary) hypertension: Secondary | ICD-10-CM

## 2019-11-17 DIAGNOSIS — O99213 Obesity complicating pregnancy, third trimester: Secondary | ICD-10-CM

## 2019-11-17 DIAGNOSIS — Z3A35 35 weeks gestation of pregnancy: Secondary | ICD-10-CM

## 2019-11-17 DIAGNOSIS — Z148 Genetic carrier of other disease: Secondary | ICD-10-CM

## 2019-11-17 DIAGNOSIS — O365990 Maternal care for other known or suspected poor fetal growth, unspecified trimester, not applicable or unspecified: Secondary | ICD-10-CM | POA: Insufficient documentation

## 2019-11-17 DIAGNOSIS — O365930 Maternal care for other known or suspected poor fetal growth, third trimester, not applicable or unspecified: Secondary | ICD-10-CM

## 2019-11-23 ENCOUNTER — Other Ambulatory Visit: Payer: Self-pay

## 2019-11-23 ENCOUNTER — Other Ambulatory Visit (HOSPITAL_COMMUNITY)
Admission: RE | Admit: 2019-11-23 | Discharge: 2019-11-23 | Disposition: A | Discharge status: Home / Self Care | Payer: Medicaid Other | Source: Ambulatory Visit | Attending: Obstetrics and Gynecology | Admitting: Obstetrics and Gynecology

## 2019-11-23 ENCOUNTER — Encounter: Payer: Self-pay | Admitting: Obstetrics and Gynecology

## 2019-11-23 ENCOUNTER — Ambulatory Visit (INDEPENDENT_AMBULATORY_CARE_PROVIDER_SITE_OTHER): Payer: Medicaid Other | Admitting: Obstetrics and Gynecology

## 2019-11-23 VITALS — BP 128/74 | HR 56 | Wt 182.0 lb

## 2019-11-23 DIAGNOSIS — I10 Essential (primary) hypertension: Secondary | ICD-10-CM

## 2019-11-23 DIAGNOSIS — O09899 Supervision of other high risk pregnancies, unspecified trimester: Secondary | ICD-10-CM | POA: Insufficient documentation

## 2019-11-23 DIAGNOSIS — O365931 Maternal care for other known or suspected poor fetal growth, third trimester, fetus 1: Secondary | ICD-10-CM

## 2019-11-23 NOTE — Progress Notes (Signed)
   PRENATAL VISIT NOTE  Subjective:  Bianca Jenkins is a 34 y.o. G3P2002 at [redacted]w[redacted]d being seen today for ongoing prenatal care.  She is currently monitored for the following issues for this high-risk pregnancy and has Supervision of other high risk pregnancy, antepartum; Chronic hypertension; and Pregnancy affected by fetal growth restriction on their problem list.  Patient reports no complaints.  Contractions: Not present. Vag. Bleeding: None.  Movement: Present. Denies leaking of fluid.   The following portions of the patient's history were reviewed and updated as appropriate: allergies, current medications, past family history, past medical history, past social history, past surgical history and problem list.   Objective:   Vitals:   11/23/19 1518  BP: 128/74  Pulse: (!) 56  Weight: 182 lb (82.6 kg)    Fetal Status: Fetal Heart Rate (bpm): 136 Fundal Height: 34 cm Movement: Present     General:  Alert, oriented and cooperative. Patient is in no acute distress.  Skin: Skin is warm and dry. No rash noted.   Cardiovascular: Normal heart rate noted  Respiratory: Normal respiratory effort, no problems with respiration noted  Abdomen: Soft, gravid, appropriate for gestational age.  Pain/Pressure: Present     Pelvic: Cervical exam deferred        Extremities: Normal range of motion.  Edema: Trace  Mental Status: Normal mood and affect. Normal behavior. Normal judgment and thought content.   Assessment and Plan:  Pregnancy: G3P2002 at [redacted]w[redacted]d 1. Supervision of other high risk pregnancy, antepartum - GBS and genital cultures collected.   2. Chronic hypertension - Continue labetalol - Continue antenatal testing. - PreEclampsia precautions given.    3. IUGR (intrauterine growth restriction) affecting care of mother, third trimester, fetus 1 - Patient for repeat growth scan with dopplers tomorrow.  We will then determine timing of induction of labor.    Preterm labor symptoms and  general obstetric precautions including but not limited to vaginal bleeding, contractions, leaking of fluid and fetal movement were reviewed in detail with the patient. Please refer to After Visit Summary for other counseling recommendations.   Return in about 1 week (around 11/30/2019) for HROB.  Future Appointments  Date Time Provider Department Center  11/24/2019  3:00 PM [facility] NURSE [facility]  11/24/2019  3:15 PM [facility] US2 [facility]    Johnny Bridge, MD

## 2019-11-23 NOTE — Progress Notes (Signed)
ROB/GBS.  Declined FLU and TDAP.   vaccines.

## 2019-11-24 ENCOUNTER — Ambulatory Visit: Payer: Medicaid Other | Attending: Obstetrics and Gynecology

## 2019-11-24 ENCOUNTER — Ambulatory Visit: Payer: Medicaid Other | Admitting: *Deleted

## 2019-11-24 DIAGNOSIS — I10 Essential (primary) hypertension: Secondary | ICD-10-CM | POA: Diagnosis not present

## 2019-11-24 DIAGNOSIS — O99213 Obesity complicating pregnancy, third trimester: Secondary | ICD-10-CM

## 2019-11-24 DIAGNOSIS — O365930 Maternal care for other known or suspected poor fetal growth, third trimester, not applicable or unspecified: Secondary | ICD-10-CM | POA: Diagnosis not present

## 2019-11-24 DIAGNOSIS — Z362 Encounter for other antenatal screening follow-up: Secondary | ICD-10-CM

## 2019-11-24 DIAGNOSIS — O365990 Maternal care for other known or suspected poor fetal growth, unspecified trimester, not applicable or unspecified: Secondary | ICD-10-CM

## 2019-11-24 DIAGNOSIS — O10913 Unspecified pre-existing hypertension complicating pregnancy, third trimester: Secondary | ICD-10-CM

## 2019-11-24 DIAGNOSIS — E669 Obesity, unspecified: Secondary | ICD-10-CM

## 2019-11-24 DIAGNOSIS — Z148 Genetic carrier of other disease: Secondary | ICD-10-CM

## 2019-11-24 DIAGNOSIS — Z3A36 36 weeks gestation of pregnancy: Secondary | ICD-10-CM

## 2019-11-24 LAB — CERVICOVAGINAL ANCILLARY ONLY
Chlamydia: NEGATIVE
Comment: NEGATIVE
Comment: NORMAL
Neisseria Gonorrhea: NEGATIVE

## 2019-11-25 LAB — STREP GP B NAA: Strep Gp B NAA: NEGATIVE

## 2019-11-29 ENCOUNTER — Encounter (HOSPITAL_COMMUNITY): Payer: Self-pay | Admitting: Obstetrics and Gynecology

## 2019-11-29 ENCOUNTER — Other Ambulatory Visit: Payer: Self-pay

## 2019-11-29 ENCOUNTER — Inpatient Hospital Stay (HOSPITAL_COMMUNITY)
Admission: AD | Admit: 2019-11-29 | Discharge: 2019-12-02 | DRG: 806 | Disposition: A | Discharge status: Home / Self Care | Payer: Medicaid Other | Attending: Obstetrics and Gynecology | Admitting: Obstetrics and Gynecology

## 2019-11-29 DIAGNOSIS — O99334 Smoking (tobacco) complicating childbirth: Secondary | ICD-10-CM | POA: Diagnosis present

## 2019-11-29 DIAGNOSIS — O10913 Unspecified pre-existing hypertension complicating pregnancy, third trimester: Secondary | ICD-10-CM | POA: Diagnosis not present

## 2019-11-29 DIAGNOSIS — O365930 Maternal care for other known or suspected poor fetal growth, third trimester, not applicable or unspecified: Secondary | ICD-10-CM | POA: Diagnosis present

## 2019-11-29 DIAGNOSIS — Z20822 Contact with and (suspected) exposure to covid-19: Secondary | ICD-10-CM | POA: Diagnosis present

## 2019-11-29 DIAGNOSIS — O99214 Obesity complicating childbirth: Secondary | ICD-10-CM | POA: Diagnosis present

## 2019-11-29 DIAGNOSIS — O365990 Maternal care for other known or suspected poor fetal growth, unspecified trimester, not applicable or unspecified: Secondary | ICD-10-CM

## 2019-11-29 DIAGNOSIS — F17210 Nicotine dependence, cigarettes, uncomplicated: Secondary | ICD-10-CM | POA: Diagnosis present

## 2019-11-29 DIAGNOSIS — O4593 Premature separation of placenta, unspecified, third trimester: Principal | ICD-10-CM | POA: Diagnosis present

## 2019-11-29 DIAGNOSIS — I10 Essential (primary) hypertension: Secondary | ICD-10-CM | POA: Diagnosis not present

## 2019-11-29 DIAGNOSIS — Z9119 Patient's noncompliance with other medical treatment and regimen: Secondary | ICD-10-CM | POA: Diagnosis not present

## 2019-11-29 DIAGNOSIS — O114 Pre-existing hypertension with pre-eclampsia, complicating childbirth: Secondary | ICD-10-CM | POA: Diagnosis present

## 2019-11-29 DIAGNOSIS — E669 Obesity, unspecified: Secondary | ICD-10-CM | POA: Diagnosis present

## 2019-11-29 DIAGNOSIS — O1002 Pre-existing essential hypertension complicating childbirth: Secondary | ICD-10-CM | POA: Diagnosis present

## 2019-11-29 DIAGNOSIS — Z3A37 37 weeks gestation of pregnancy: Secondary | ICD-10-CM | POA: Diagnosis not present

## 2019-11-29 DIAGNOSIS — O119 Pre-existing hypertension with pre-eclampsia, unspecified trimester: Secondary | ICD-10-CM | POA: Clinically undetermined

## 2019-11-29 DIAGNOSIS — O26893 Other specified pregnancy related conditions, third trimester: Secondary | ICD-10-CM | POA: Diagnosis present

## 2019-11-29 LAB — TYPE AND SCREEN
ABO/RH(D): A POS
Antibody Screen: NEGATIVE

## 2019-11-29 LAB — CBC
HCT: 31.4 % — ABNORMAL LOW (ref 36.0–46.0)
Hemoglobin: 10.2 g/dL — ABNORMAL LOW (ref 12.0–15.0)
MCH: 27.8 pg (ref 26.0–34.0)
MCHC: 32.5 g/dL (ref 30.0–36.0)
MCV: 85.6 fL (ref 80.0–100.0)
Platelets: 233 10*3/uL (ref 150–400)
RBC: 3.67 MIL/uL — ABNORMAL LOW (ref 3.87–5.11)
RDW: 14.3 % (ref 11.5–15.5)
WBC: 11.2 10*3/uL — ABNORMAL HIGH (ref 4.0–10.5)
nRBC: 0 % (ref 0.0–0.2)

## 2019-11-29 LAB — COMPREHENSIVE METABOLIC PANEL
ALT: 13 U/L (ref 0–44)
AST: 27 U/L (ref 15–41)
Albumin: 2.7 g/dL — ABNORMAL LOW (ref 3.5–5.0)
Alkaline Phosphatase: 117 U/L (ref 38–126)
Anion gap: 10 (ref 5–15)
BUN: 6 mg/dL (ref 6–20)
CO2: 20 mmol/L — ABNORMAL LOW (ref 22–32)
Calcium: 8.7 mg/dL — ABNORMAL LOW (ref 8.9–10.3)
Chloride: 104 mmol/L (ref 98–111)
Creatinine, Ser: 0.83 mg/dL (ref 0.44–1.00)
GFR calc Af Amer: >60 mL/min (ref >60)
GFR calc non Af Amer: >60 mL/min (ref >60)
Glucose, Bld: 103 mg/dL — ABNORMAL HIGH (ref 70–99)
Potassium: 3.8 mmol/L (ref 3.5–5.1)
Sodium: 134 mmol/L — ABNORMAL LOW (ref 135–145)
Total Bilirubin: 0.6 mg/dL (ref 0.3–1.2)
Total Protein: 6.3 g/dL — ABNORMAL LOW (ref 6.5–8.1)

## 2019-11-29 LAB — SARS CORONAVIRUS 2 BY RT PCR (HOSPITAL ORDER, PERFORMED IN ~~LOC~~ HOSPITAL LAB): SARS Coronavirus 2: NEGATIVE

## 2019-11-29 MED ORDER — LABETALOL HCL 5 MG/ML IV SOLN
20.0000 mg | INTRAVENOUS | Status: DC | PRN
  Administered 2019-11-29: 20 mg via INTRAVENOUS
  Filled 2019-11-29: qty 4

## 2019-11-29 MED ORDER — LABETALOL HCL 5 MG/ML IV SOLN
40.0000 mg | INTRAVENOUS | Status: DC | PRN
  Administered 2019-11-29: 40 mg via INTRAVENOUS
  Filled 2019-11-29 (×3): qty 8

## 2019-11-29 MED ORDER — LABETALOL HCL 200 MG PO TABS
200.0000 mg | ORAL_TABLET | Freq: Once | ORAL | Status: AC
  Administered 2019-11-29: 200 mg via ORAL
  Filled 2019-11-29: qty 1

## 2019-11-29 MED ORDER — HYDRALAZINE HCL 20 MG/ML IJ SOLN
10.0000 mg | INTRAMUSCULAR | Status: DC | PRN
  Administered 2019-11-30: 10 mg via INTRAVENOUS
  Filled 2019-11-29 (×2): qty 1

## 2019-11-29 MED ORDER — LABETALOL HCL 200 MG PO TABS
200.0000 mg | ORAL_TABLET | Freq: Two times a day (BID) | ORAL | Status: DC
  Administered 2019-11-29: 200 mg via ORAL
  Filled 2019-11-29: qty 2

## 2019-11-29 MED ORDER — LABETALOL HCL 5 MG/ML IV SOLN
80.0000 mg | INTRAVENOUS | Status: DC | PRN
  Administered 2019-11-29: 80 mg via INTRAVENOUS

## 2019-11-29 MED ORDER — LACTATED RINGERS IV SOLN: INTRAVENOUS | Status: DC

## 2019-11-29 MED ORDER — LABETALOL HCL 200 MG PO TABS: 200.0000 mg | ORAL_TABLET | Freq: Two times a day (BID) | ORAL | Status: DC

## 2019-11-29 NOTE — MAU Provider Note (Signed)
Bianca Jenkins is a 33 y.o. female G3P2002 with IUP at [redacted]w[redacted]d. [facility] at [facility] since 12 wks. She came to MAU tonight d/t VB.  She thought she was leaking urine, but it was blood. Has mod amt bright red blood on underware/towel, some on new pad.  No pain. Baby active.  Last US EFW <3%.  Only takes BP meds in the am "because they make me feel funny if I take it twice a day."  Last dose this am.    Prenatal History/Complications:  CHTN, noncompliant w/medication regimen IUGR Acute onset of vaginal bleeding/ presumed abruption   Past Medical History: Past Medical History:  Diagnosis Date  . Hypertension     Past Surgical History: Past Surgical History:  Procedure Laterality Date  . NO PAST SURGERIES      Obstetrical History: OB History    Gravida  3   Para  2   Term  2   Preterm  0   AB  0   Living  2     SAB  0   TAB  0   Ectopic  0   Multiple  0   Live Births  2           Social History: Social History   Socioeconomic History  . Marital status: Single    Spouse name: Not on file  . Number of children: Not on file  . Years of education: Not on file  . Highest education level: Not on file  Occupational History  . Not on file  Tobacco Use  . Smoking status: Current Every Day Smoker    Packs/day: 0.10  . Smokeless tobacco: Never Used  Vaping Use  . Vaping Use: Never used  Substance and Sexual Activity  . Alcohol use: No  . Drug use: No  . Sexual activity: Yes  Other Topics Concern  . Not on file  Social History Narrative  . Not on file   Social Determinants of Health   Financial Resource Strain:   . Difficulty of Paying Living Expenses: Not on file  Food Insecurity:   . Worried About Running Out of Food in the Last Year: Not on file  . Ran Out of Food in the Last Year: Not on file  Transportation Needs:   . Lack of Transportation (Medical): Not on file  . Lack of Transportation (Non-Medical): Not on file  Physical Activity:   . Days  of Exercise per Week: Not on file  . Minutes of Exercise per Session: Not on file  Stress:   . Feeling of Stress : Not on file  Social Connections:   . Frequency of Communication with Friends and Family: Not on file  . Frequency of Social Gatherings with Friends and Family: Not on file  . Attends Religious Services: Not on file  . Active Member of Clubs or Organizations: Not on file  . Attends Club or Organization Meetings: Not on file  . Marital Status: Not on file    Family History: Family History  Problem Relation Age of Onset  . Hypertension Mother   . Hypertension Maternal Grandmother     Allergies: No Known Allergies  Medications Prior to Admission  Medication Sig Dispense Refill Last Dose  . Ferrous Sulfate (IRON PO) Take by mouth.   11/29/2019 at Unknown time  . labetalol (NORMODYNE) 200 MG tablet Take 1 tablet (200 mg total) by mouth 2 (two) times daily. 60 tablet 5 11/29/2019 at Unknown time  .   Prenat w/o A-FeCbn-Meth-FA-DHA (PRENATE MINI) 29-0.6-0.4-350 MG CAPS Take 1 capsule by mouth daily before breakfast. 90 capsule 3 11/29/2019 at Unknown time  . Elastic Bandages & Supports (COMFORT FIT MATERNITY SUPP SM) MISC Wear as directed. (Patient not taking: Reported on 10/18/2019) 1 each 0 Unknown at Unknown time        Review of Systems   Constitutional: Negative for fever and chills Eyes: Negative for visual disturbances Respiratory: Negative for shortness of breath, dyspnea Cardiovascular: Negative for chest pain or palpitations  Gastrointestinal: Negative for abdominal pain, vomiting, diarrhea and constipation.   Genitourinary: Negative for dysuria and urgency Musculoskeletal: Negative for back pain, joint pain, myalgias  Neurological: Negative for dizziness and headaches      Temperature 98.4 F (36.9 C), temperature source Oral, resp. rate 16, weight 83.5 kg, last menstrual period 03/14/2019, SpO2 100 %. General appearance: alert, cooperative and no  distress Lungs: normal respiratory effort Heart: regular rate and rhythm Abdomen: soft, non-tender; bowel sounds normal Extremities: Homans sign is negative, no sign of DVT DTR's 2+ Presentation: cephalic Fetal monitoring  Baseline: 150 bpm, Variability: Good {> 6 bpm), Accelerations: none and Decelerations: Absent (has been on EFW for 10 minutes).  Uterine activity  2-4 minutes. Has felt ctx all day.       Prenatal labs: ABO, Rh: A/Positive/-- (04/19 1040) Antibody: Negative (04/19 1040) Rubella: immune RPR: Non Reactive (07/19 1314)  HBsAg: Negative (04/19 1040)  HIV: Non Reactive (07/19 1314)  GBS: Negative/-- (08/31 1554)  . Nursing Staff Provider  Office Location  [facility] Dating  LMP  Language  English Anatomy US  08/03/19  Flu Vaccine   Declined 11/23/2019 Genetic Screen  NIPS: Horizon - Alpha thal, inc carrier risk SMA, LR nips AFP:   First Screen:  Quad:    TDaP vaccine   declined 11/23/2019 Hgb A1C or  GTT Early  Third trimester Normal  Rhogam     LAB RESULTS     Blood Type A/Positive/-- (04/19 1040)   Feeding Plan Bottle Antibody Negative (04/19 1040)  Contraception undecided Rubella 26.40 (04/19 1040)  Circumcision Girl  RPR Non Reactive (07/19 1314)   Pediatrician  Undecided 8/17 HBsAg Negative (04/19 1040)   Support Person Sister & sister-in-law HCVAb neg  Prenatal Classes no HIV Non Reactive (07/19 1314)     BTL Consent  GBS   Negative  VBAC Consent  Pap 07/12/19 normal     Hgb Electro  aa  BP Cuff Pt has cuff at home CF     SMA Inc carrier risk     Waterbirth  [ ] Class [ ] Consent [ ] CNM visit     Prenatal Transfer Tool  Maternal Diabetes: No Genetic Screening: Normal Maternal Ultrasounds/Referrals: IUGR Fetal Ultrasounds or other Referrals:  None Maternal Substance Abuse:  No Significant Maternal Medications:  None Significant Maternal Lab Results: Group B Strep negative    No results found for this or any previous visit (from the past 24  hour(s)).  Assessment: Bianca Jenkins is a 33 y.o. G3P2002 with an IUP at [redacted]w[redacted]d presenting for IOL for IUGR/VB/CHTN non compliant w/meds.  Plan: Discussed w/Dr. Bass. #Labor: contracting now, may be starting labor. Expectant mgt for now. #Pain:  Per request #FWB Cat 1   Bianca Damaso Cresenzo-Dishmon 11/29/2019, 8:40 PM     No results found for this or any previous visit (from the past 24 hour(s)).   Assessment: Bianca Jenkins is a 34 y.o. [phone] with an IUP at [redacted]w[redacted]d presenting for IOL for IUGR/VB/CHTN non compliant w/meds.   Plan: Discussed w/Dr. Donavan Foil. #Labor: contracting now, may be starting labor. Expectant mgt for now. #Pain:  Per request #FWB Cat 1     Jacklyn Shell 11/29/2019, 8:40 PM

## 2019-11-29 NOTE — H&P (Signed)
Bianca Jenkins is a 34 y.o. female G3P2002 with IUP at [redacted]w[redacted]d. [facility] at [facility] since 12 wks. She came to MAU tonight d/t VB.  She thought she was leaking urine, but it was blood. Has mod amt bright red blood on underware/towel, some on new pad.  No pain. Baby active.  Last [place] EFW <3%.  Only takes BP meds in the am "because they make me feel funny if I take it twice a day."  Last dose this am.    Prenatal History/Complications:  CHTN, noncompliant w/medication regimen IUGR Acute onset of vaginal bleeding/ presumed abruption   Past Medical History: Past Medical History:  Diagnosis Date  . Hypertension     Past Surgical History: Past Surgical History:  Procedure Laterality Date  . NO PAST SURGERIES      Obstetrical History: OB History    Gravida  3   Para  2   Term  2   Preterm  0   AB  0   Living  2     SAB  0   TAB  0   Ectopic  0   Multiple  0   Live Births  2           Social History: Social History   Socioeconomic History  . Marital status: Single    Spouse name: Not on file  . Number of children: Not on file  . Years of education: Not on file  . Highest education level: Not on file  Occupational History  . Not on file  Tobacco Use  . Smoking status: Current Every Day Smoker    Packs/day: 0.10  . Smokeless tobacco: Never Used  Vaping Use  . Vaping Use: Never used  Substance and Sexual Activity  . Alcohol use: No  . Drug use: No  . Sexual activity: Yes  Other Topics Concern  . Not on file  Social History Narrative  . Not on file   Social Determinants of Health   Financial Resource Strain:   . Difficulty of Paying Living Expenses: Not on file  Food Insecurity:   . Worried About Programme researcher, broadcasting/film/video in the Last Year: Not on file  . Ran Out of Food in the Last Year: Not on file  Transportation Needs:   . Lack of Transportation (Medical): Not on file  . Lack of Transportation (Non-Medical): Not on file  Physical Activity:   . Days  of Exercise per Week: Not on file  . Minutes of Exercise per Session: Not on file  Stress:   . Feeling of Stress : Not on file  Social Connections:   . Frequency of Communication with Friends and Family: Not on file  . Frequency of Social Gatherings with Friends and Family: Not on file  . Attends Religious Services: Not on file  . Active Member of Clubs or Organizations: Not on file  . Attends Banker Meetings: Not on file  . Marital Status: Not on file    Family History: Family History  Problem Relation Age of Onset  . Hypertension Mother   . Hypertension Maternal Grandmother     Allergies: No Known Allergies  Medications Prior to Admission  Medication Sig Dispense Refill Last Dose  . Ferrous Sulfate (IRON PO) Take by mouth.   11/29/2019 at Unknown time  . labetalol (NORMODYNE) 200 MG tablet Take 1 tablet (200 mg total) by mouth 2 (two) times daily. 60 tablet 5 11/29/2019 at Unknown time  .  Prenat w/o A-FeCbn-Meth-FA-DHA (PRENATE MINI) 29-0.6-0.4-350 MG CAPS Take 1 capsule by mouth daily before breakfast. 90 capsule 3 11/29/2019 at Unknown time  . Elastic Bandages & Supports (COMFORT FIT MATERNITY SUPP SM) MISC Wear as directed. (Patient not taking: Reported on 10/18/2019) 1 each 0 Unknown at Unknown time        Review of Systems   Constitutional: Negative for fever and chills Eyes: Negative for visual disturbances Respiratory: Negative for shortness of breath, dyspnea Cardiovascular: Negative for chest pain or palpitations  Gastrointestinal: Negative for abdominal pain, vomiting, diarrhea and constipation.   Genitourinary: Negative for dysuria and urgency Musculoskeletal: Negative for back pain, joint pain, myalgias  Neurological: Negative for dizziness and headaches      Temperature 98.4 F (36.9 C), temperature source Oral, resp. rate 16, weight 83.5 kg, last menstrual period 03/14/2019, SpO2 100 %. General appearance: alert, cooperative and no  distress Lungs: normal respiratory effort Heart: regular rate and rhythm Abdomen: soft, non-tender; bowel sounds normal Extremities: Homans sign is negative, no sign of DVT DTR's 2+ Presentation: cephalic Fetal monitoring  Baseline: 150 bpm, Variability: Good {> 6 bpm), Accelerations: none and Decelerations: Absent (has been on EFW for 10 minutes).  Uterine activity  2-4 minutes. Has felt ctx all day.       Prenatal labs: ABO, Rh: A/Positive/-- (04/19 1040) Antibody: Negative (04/19 1040) Rubella: immune RPR: Non Reactive (07/19 1314)  HBsAg: Negative (04/19 1040)  HIV: Non Reactive (07/19 1314)  GBS: Negative/-- (08/31 1554)  . Nursing Staff Provider  Office Location  [facility] Dating  LMP  Language  English Anatomy US  08/03/19  Flu Vaccine   Declined 11/23/2019 Genetic Screen  NIPS: Horizon - Alpha thal, inc carrier risk SMA, LR nips AFP:   First Screen:  Quad:    TDaP vaccine   declined 11/23/2019 Hgb A1C or  GTT Early  Third trimester Normal  Rhogam     LAB RESULTS     Blood Type A/Positive/-- (04/19 1040)   Feeding Plan Bottle Antibody Negative (04/19 1040)  Contraception undecided Rubella 26.40 (04/19 1040)  Circumcision Girl  RPR Non Reactive (07/19 1314)   Pediatrician  Undecided 8/17 HBsAg Negative (04/19 1040)   Support Person Sister & sister-in-law HCVAb neg  Prenatal Classes no HIV Non Reactive (07/19 1314)     BTL Consent  GBS   Negative  VBAC Consent  Pap 07/12/19 normal     Hgb Electro  aa  BP Cuff Pt has cuff at home CF     SMA Inc carrier risk     Waterbirth  [ ]  Class [ ]  Consent [ ]  CNM visit     Prenatal Transfer Tool  Maternal Diabetes: No Genetic Screening: Normal Maternal Ultrasounds/Referrals: IUGR Fetal Ultrasounds or other Referrals:  None Maternal Substance Abuse:  No Significant Maternal Medications:  None Significant Maternal Lab Results: Group B Strep negative    No results found for this or any previous visit (from the past 24  hour(s)).  Assessment: Bianca Jenkins is a 34 y.o. [phone] with an IUP at [redacted]w[redacted]d presenting for IOL for IUGR/VB/CHTN non compliant w/meds.  Plan: Discussed w/Dr. 32. #Labor: contracting now, may be starting labor. Expectant mgt for now. #Pain:  Per request #FWB Cat 1   [id number] 11/29/2019, 8:40 PM

## 2019-11-29 NOTE — MAU Note (Signed)
Pt reports that she just starting having gushes of blood about 30  Minutes ago.  +FM

## 2019-11-30 ENCOUNTER — Inpatient Hospital Stay (HOSPITAL_COMMUNITY): Payer: Medicaid Other | Admitting: Anesthesiology

## 2019-11-30 ENCOUNTER — Encounter: Payer: Medicaid Other | Admitting: Obstetrics and Gynecology

## 2019-11-30 ENCOUNTER — Encounter (HOSPITAL_COMMUNITY): Payer: Self-pay | Admitting: Obstetrics and Gynecology

## 2019-11-30 DIAGNOSIS — O365930 Maternal care for other known or suspected poor fetal growth, third trimester, not applicable or unspecified: Secondary | ICD-10-CM

## 2019-11-30 DIAGNOSIS — O10913 Unspecified pre-existing hypertension complicating pregnancy, third trimester: Secondary | ICD-10-CM

## 2019-11-30 DIAGNOSIS — Z3A37 37 weeks gestation of pregnancy: Secondary | ICD-10-CM

## 2019-11-30 DIAGNOSIS — I10 Essential (primary) hypertension: Secondary | ICD-10-CM

## 2019-11-30 LAB — CBC
HCT: 31.8 % — ABNORMAL LOW (ref 36.0–46.0)
Hemoglobin: 10.6 g/dL — ABNORMAL LOW (ref 12.0–15.0)
MCH: 28.3 pg (ref 26.0–34.0)
MCHC: 33.3 g/dL (ref 30.0–36.0)
MCV: 84.8 fL (ref 80.0–100.0)
Platelets: 238 10*3/uL (ref 150–400)
RBC: 3.75 MIL/uL — ABNORMAL LOW (ref 3.87–5.11)
RDW: 14.2 % (ref 11.5–15.5)
WBC: 11.7 10*3/uL — ABNORMAL HIGH (ref 4.0–10.5)
nRBC: 0 % (ref 0.0–0.2)

## 2019-11-30 LAB — RPR: RPR Ser Ql: NONREACTIVE

## 2019-11-30 MED ORDER — HYDROXYZINE HCL 50 MG PO TABS: 50.0000 mg | ORAL_TABLET | Freq: Four times a day (QID) | ORAL | Status: DC | PRN

## 2019-11-30 MED ORDER — ZOLPIDEM TARTRATE 5 MG PO TABS
5.0000 mg | ORAL_TABLET | Freq: Every evening | ORAL | Status: DC | PRN
  Administered 2019-11-30: 5 mg via ORAL
  Filled 2019-11-30: qty 1

## 2019-11-30 MED ORDER — NIFEDIPINE 10 MG PO CAPS: 20.0000 mg | ORAL_CAPSULE | ORAL | Status: DC | PRN

## 2019-11-30 MED ORDER — IBUPROFEN 600 MG PO TABS
600.0000 mg | ORAL_TABLET | Freq: Three times a day (TID) | ORAL | Status: DC
  Administered 2019-11-30 – 2019-12-02 (×5): 600 mg via ORAL
  Filled 2019-11-30 (×6): qty 1

## 2019-11-30 MED ORDER — DIPHENHYDRAMINE HCL 25 MG PO CAPS: 25.0000 mg | ORAL_CAPSULE | Freq: Four times a day (QID) | ORAL | Status: DC | PRN

## 2019-11-30 MED ORDER — PHENYLEPHRINE 40 MCG/ML (10ML) SYRINGE FOR IV PUSH (FOR BLOOD PRESSURE SUPPORT): 80.0000 ug | PREFILLED_SYRINGE | INTRAVENOUS | Status: DC | PRN

## 2019-11-30 MED ORDER — SENNOSIDES-DOCUSATE SODIUM 8.6-50 MG PO TABS
2.0000 | ORAL_TABLET | ORAL | Status: DC
  Administered 2019-11-30: 2 via ORAL
  Filled 2019-11-30 (×2): qty 2

## 2019-11-30 MED ORDER — LABETALOL HCL 5 MG/ML IV SOLN: 40.0000 mg | INTRAVENOUS | Status: DC | PRN

## 2019-11-30 MED ORDER — SIMETHICONE 80 MG PO CHEW: 80.0000 mg | CHEWABLE_TABLET | ORAL | Status: DC | PRN

## 2019-11-30 MED ORDER — LIDOCAINE HCL (PF) 1 % IJ SOLN: 30.0000 mL | INTRAMUSCULAR | Status: DC | PRN

## 2019-11-30 MED ORDER — ACETAMINOPHEN 325 MG PO TABS
650.0000 mg | ORAL_TABLET | ORAL | Status: DC | PRN
  Administered 2019-11-30: 650 mg via ORAL
  Filled 2019-11-30 (×2): qty 2

## 2019-11-30 MED ORDER — ACETAMINOPHEN 325 MG PO TABS
650.0000 mg | ORAL_TABLET | Freq: Four times a day (QID) | ORAL | Status: DC
  Administered 2019-11-30 – 2019-12-01 (×4): 650 mg via ORAL
  Filled 2019-11-30 (×7): qty 2

## 2019-11-30 MED ORDER — OXYTOCIN-SODIUM CHLORIDE 30-0.9 UT/500ML-% IV SOLN
2.5000 [IU]/h | INTRAVENOUS | Status: DC
  Administered 2019-11-30: 2.5 [IU]/h via INTRAVENOUS

## 2019-11-30 MED ORDER — MAGNESIUM SULFATE 40 GM/1000ML IV SOLN
2.0000 g/h | INTRAVENOUS | Status: DC
  Administered 2019-11-30: 2 g/h via INTRAVENOUS
  Filled 2019-11-30: qty 1000

## 2019-11-30 MED ORDER — NIFEDIPINE 10 MG PO CAPS
10.0000 mg | ORAL_CAPSULE | ORAL | Status: DC | PRN
  Administered 2019-11-30 (×2): 10 mg via ORAL
  Filled 2019-11-30 (×3): qty 1

## 2019-11-30 MED ORDER — FLEET ENEMA 7-19 GM/118ML RE ENEM: 1.0000 | ENEMA | RECTAL | Status: DC | PRN

## 2019-11-30 MED ORDER — OXYCODONE-ACETAMINOPHEN 5-325 MG PO TABS: 1.0000 | ORAL_TABLET | ORAL | Status: DC | PRN

## 2019-11-30 MED ORDER — NIFEDIPINE 10 MG PO CAPS
10.0000 mg | ORAL_CAPSULE | ORAL | Status: DC | PRN
  Filled 2019-11-30: qty 1

## 2019-11-30 MED ORDER — LACTATED RINGERS IV SOLN: INTRAVENOUS | Status: DC

## 2019-11-30 MED ORDER — SODIUM CHLORIDE (PF) 0.9 % IJ SOLN
INTRAMUSCULAR | Status: DC | PRN
Start: 2019-11-30 — End: 2019-12-01
  Administered 2019-11-30: 12 mL/h via EPIDURAL

## 2019-11-30 MED ORDER — LACTATED RINGERS IV SOLN: 500.0000 mL | INTRAVENOUS | Status: DC | PRN

## 2019-11-30 MED ORDER — LABETALOL HCL 200 MG PO TABS: 400.0000 mg | ORAL_TABLET | Freq: Two times a day (BID) | ORAL | Status: DC

## 2019-11-30 MED ORDER — NIFEDIPINE ER OSMOTIC RELEASE 30 MG PO TB24
30.0000 mg | ORAL_TABLET | Freq: Every day | ORAL | Status: DC
  Administered 2019-11-30: 30 mg via ORAL
  Filled 2019-11-30: qty 1

## 2019-11-30 MED ORDER — FENTANYL CITRATE (PF) 100 MCG/2ML IJ SOLN
50.0000 ug | INTRAMUSCULAR | Status: DC | PRN
  Administered 2019-11-30 (×2): 100 ug via INTRAVENOUS
  Filled 2019-11-30 (×2): qty 2

## 2019-11-30 MED ORDER — TERBUTALINE SULFATE 1 MG/ML IJ SOLN: 0.2500 mg | Freq: Once | INTRAMUSCULAR | Status: DC | PRN

## 2019-11-30 MED ORDER — ONDANSETRON HCL 4 MG/2ML IJ SOLN: 4.0000 mg | INTRAMUSCULAR | Status: DC | PRN

## 2019-11-30 MED ORDER — EPHEDRINE 5 MG/ML INJ: 10.0000 mg | INTRAVENOUS | Status: DC | PRN

## 2019-11-30 MED ORDER — OXYTOCIN-SODIUM CHLORIDE 30-0.9 UT/500ML-% IV SOLN
1.0000 m[IU]/min | INTRAVENOUS | Status: DC
  Administered 2019-11-30: 2 m[IU]/min via INTRAVENOUS
  Filled 2019-11-30: qty 500

## 2019-11-30 MED ORDER — ONDANSETRON HCL 4 MG PO TABS: 4.0000 mg | ORAL_TABLET | ORAL | Status: DC | PRN

## 2019-11-30 MED ORDER — LABETALOL HCL 200 MG PO TABS
400.0000 mg | ORAL_TABLET | Freq: Once | ORAL | Status: AC
  Administered 2019-11-30: 400 mg via ORAL
  Filled 2019-11-30: qty 2

## 2019-11-30 MED ORDER — PRENATAL MULTIVITAMIN CH
1.0000 | ORAL_TABLET | Freq: Every day | ORAL | Status: DC
  Administered 2019-11-30 – 2019-12-01 (×3): 1 via ORAL
  Filled 2019-11-30 (×3): qty 1

## 2019-11-30 MED ORDER — LACTATED RINGERS IV SOLN
500.0000 mL | Freq: Once | INTRAVENOUS | Status: AC
  Administered 2019-11-30: 500 mL via INTRAVENOUS

## 2019-11-30 MED ORDER — WITCH HAZEL-GLYCERIN EX PADS: 1.0000 "application " | MEDICATED_PAD | CUTANEOUS | Status: DC | PRN

## 2019-11-30 MED ORDER — MAGNESIUM SULFATE 40 GM/1000ML IV SOLN
2.0000 g/h | INTRAVENOUS | Status: AC
  Administered 2019-11-30 (×2): 2 g/h via INTRAVENOUS
  Filled 2019-11-30: qty 1000

## 2019-11-30 MED ORDER — DIPHENHYDRAMINE HCL 50 MG/ML IJ SOLN: 12.5000 mg | INTRAMUSCULAR | Status: DC | PRN

## 2019-11-30 MED ORDER — MAGNESIUM SULFATE BOLUS VIA INFUSION
4.0000 g | Freq: Once | INTRAVENOUS | Status: AC
  Administered 2019-11-30: 4 g via INTRAVENOUS
  Filled 2019-11-30: qty 1000

## 2019-11-30 MED ORDER — FENTANYL-BUPIVACAINE-NACL 0.5-0.125-0.9 MG/250ML-% EP SOLN
12.0000 mL/h | EPIDURAL | Status: DC | PRN
  Filled 2019-11-30: qty 250

## 2019-11-30 MED ORDER — TETANUS-DIPHTH-ACELL PERTUSSIS 5-2.5-18.5 LF-MCG/0.5 IM SUSP: 0.5000 mL | Freq: Once | INTRAMUSCULAR | Status: DC

## 2019-11-30 MED ORDER — COCONUT OIL OIL: 1.0000 "application " | TOPICAL_OIL | Status: DC | PRN

## 2019-11-30 MED ORDER — ZOLPIDEM TARTRATE 5 MG PO TABS: 5.0000 mg | ORAL_TABLET | Freq: Every evening | ORAL | Status: DC | PRN

## 2019-11-30 MED ORDER — BENZOCAINE-MENTHOL 20-0.5 % EX AERO: 1.0000 "application " | INHALATION_SPRAY | CUTANEOUS | Status: DC | PRN

## 2019-11-30 MED ORDER — DIBUCAINE (PERIANAL) 1 % EX OINT: 1.0000 "application " | TOPICAL_OINTMENT | CUTANEOUS | Status: DC | PRN

## 2019-11-30 MED ORDER — OXYCODONE-ACETAMINOPHEN 5-325 MG PO TABS: 2.0000 | ORAL_TABLET | ORAL | Status: DC | PRN

## 2019-11-30 MED ORDER — ONDANSETRON HCL 4 MG/2ML IJ SOLN
4.0000 mg | Freq: Four times a day (QID) | INTRAMUSCULAR | Status: DC | PRN
  Filled 2019-11-30: qty 2

## 2019-11-30 MED ORDER — ONDANSETRON HCL 4 MG/2ML IJ SOLN
INTRAMUSCULAR | Status: AC
  Administered 2019-11-30: 4 mg via INTRAVENOUS
  Filled 2019-11-30: qty 2

## 2019-11-30 MED ORDER — TRANEXAMIC ACID-NACL 1000-0.7 MG/100ML-% IV SOLN
INTRAVENOUS | Status: AC
  Filled 2019-11-30: qty 100

## 2019-11-30 MED ORDER — LIDOCAINE HCL (PF) 1 % IJ SOLN
INTRAMUSCULAR | Status: DC | PRN
  Administered 2019-11-30: 8 mL via EPIDURAL

## 2019-11-30 MED ORDER — SOD CITRATE-CITRIC ACID 500-334 MG/5ML PO SOLN
30.0000 mL | ORAL | Status: DC | PRN
  Administered 2019-11-30: 30 mL via ORAL
  Filled 2019-11-30: qty 30

## 2019-11-30 MED ORDER — OXYTOCIN BOLUS FROM INFUSION
333.0000 mL | Freq: Once | INTRAVENOUS | Status: AC
  Administered 2019-11-30: 333 mL via INTRAVENOUS

## 2019-11-30 NOTE — Progress Notes (Signed)
Patient ID: AVEENA BARI, female   DOB: 31-May-1985, 34 y.o.   MRN: [id number]  Had Fentanyl a little after 0500 for good relief; now w/ H/A that she didn't have previously- wants to try Tylenol; has had some N/V; best BP control came after Procardia 10 dose last night; SROM 0546  BP 172/100, 162/98/ 178/100 FHR 115-120, +accels, variables Ctx q 2-4 mins with Pit 51mu/min Cx 6/90/vtx -2  IUP@37 .2wks CHTN>SIPE FGR Active labor  Will give morning dose of Labetalol early and increase it to 400mg  bid Also give an other dose of Procardia 10mg  Anticipate vag del  [facility] 11/30/2019

## 2019-11-30 NOTE — Discharge Summary (Signed)
Postpartum Discharge Summary  Date of Service updated 12/02/2019     Patient Name: Bianca Jenkins DOB: May 10, 1985 MRN: [id number]  Date of admission: 11/29/2019 Delivery date:11/30/2019  Delivering provider: Randa Ngo  Date of discharge:  12/02/2019   Admitting diagnosis: IUGR (intrauterine growth restriction) affecting care of mother [O36.5990] Intrauterine pregnancy: [redacted]w[redacted]d    Secondary diagnosis:  Active Problems:   Chronic hypertension with superimposed pre-eclampsia   Pregnancy affected by fetal growth restriction   Vaginal delivery  Additional problems: as noted above   Discharge diagnosis: Vaginal delivery                                           Post partum procedures:none Augmentation: Pitocin and IP Foley Complications: None  Hospital course: Induction of Labor With Vaginal Delivery   34y.o. yo G3P3003 at 32w2das admitted to the hospital 11/29/2019 for induction of labor.  Indication for induction: Preeclampsia.  Patient had an uncomplicated labor course as follows: Membrane Rupture Time/Date: 5:46 AM ,11/30/2019   Delivery Method:Vaginal, Spontaneous  Episiotomy: None  Lacerations:  None  Details of delivery can be found in separate delivery note.  Patient had a routine postpartum course. Patient is discharged home  12/02/2019   Newborn Data: Birth date:11/30/2019  Birth time:10:30 AM  Gender:Female  Living status:Living  Apgars:8 ,9  Weight:2265 g   Magnesium Sulfate received: Yes: Seizure prophylaxis BMZ received: No Rhophylac:N/A MMR:N/A T-DaP: declined in prenatal period Flu: declined in prenatal period Transfusion:No  Physical exam  Vitals:   12/02/19 0053 12/02/19 0504 12/02/19 0515 12/02/19 0549  BP: (!) 155/86 (!) 183/100 (!) 187/105 139/76  Pulse: (!) 48 (!) 48 (!) 50 70  Resp: 17 17    Temp: 98.3 F (36.8 C) 97.9 F (36.6 C)    TempSrc: Oral Oral    SpO2: 98% 97%    Weight:      Height:       General: alert, cooperative and no  distress Lochia: appropriate Uterine Fundus: firm Incision: N/A DVT Evaluation: No evidence of DVT seen on physical exam. Labs: Lab Results  Component Value Date   WBC 11.7 (H) 12/01/2019   HGB 10.8 (L) 12/01/2019   HCT 32.9 (L) 12/01/2019   MCV 85.0 12/01/2019   PLT 282 12/01/2019   CMP Latest Ref Rng & Units 12/01/2019  Glucose 70 - 99 mg/dL 83  BUN 6 - 20 mg/dL <5(L)  Creatinine 0.44 - 1.00 mg/dL 0.84  Sodium 135 - 145 mmol/L 135  Potassium 3.5 - 5.1 mmol/L 3.5  Chloride 98 - 111 mmol/L 100  CO2 22 - 32 mmol/L 25  Calcium 8.9 - 10.3 mg/dL 7.1(L)  Total Protein 6.5 - 8.1 g/dL 6.6  Total Bilirubin 0.3 - 1.2 mg/dL 0.5  Alkaline Phos 38 - 126 U/L 105  AST 15 - 41 U/L 19  ALT 0 - 44 U/L 13   Edinburgh Score: No flowsheet data found.   After visit meds:  Allergies as of 12/02/2019   No Known Allergies     Medication List    STOP taking these medications   acetaminophen 325 MG tablet Commonly known as: T[place] Sm Misc   labetalol 200 MG tablet Commonly known as: NORMODYNE   Prenate Mini 18-0.6-0.4-350 MG Caps     TAKE these medications   ibuprofen 600 MG tablet  Commonly known as: ADVIL Take 1 tablet (600 mg total) by mouth every 8 (eight) hours.   NIFEdipine 90 MG 24 hr tablet Commonly known as: PROCARDIA XL/NIFEDICAL-XL Take 1 tablet (90 mg total) by mouth daily.   Prenate Mini 29-0.6-0.4-350 MG Caps Take 1 capsule by mouth daily before breakfast.      Discharge home in stable condition Infant Feeding: Bottle Infant Disposition:home with mother Discharge instruction: per After Visit Summary and Postpartum booklet. Activity: Advance as tolerated. Pelvic rest for 6 weeks.  Diet: routine diet Future Appointments: Future Appointments  Date Time Provider [place]  12/07/2019  1:20 PM [place] None  12/28/2019  2:00 PM Cephas Darby, MD [facility] None   Follow up Visit:  [place] Follow up in 1 week(s).   Specialty: Obstetrics and Gynecology Why: BP check Contact information: [address], [place] [phone]              Please schedule this patient for a In person postpartum visit in 4 weeks with the following provider: Any provider. Additional Postpartum F/U:BP check 1 week  High risk pregnancy complicated by: cHTN with superimposed Preeclampsia Delivery mode:  Vaginal, Spontaneous  Anticipated Birth Control:  Unsure  12/02/2019 Emeterio Reeve, MD

## 2019-11-30 NOTE — Anesthesia Procedure Notes (Signed)
Epidural Patient location during procedure: OB Start time: 11/30/2019 10:05 AM End time: 11/30/2019 10:15 AM  Staffing Anesthesiologist: Mellody Dance, MD Performed: anesthesiologist   Preanesthetic Checklist Completed: patient identified, IV checked, site marked, risks and benefits discussed, monitors and equipment checked, pre-op evaluation and timeout performed  Epidural Patient position: sitting Prep: DuraPrep Patient monitoring: heart rate, cardiac monitor, continuous pulse ox and blood pressure Approach: midline Location: L2-L3 Injection technique: LOR saline  Needle:  Needle type: Tuohy  Needle gauge: 17 G Needle length: 9 cm Needle insertion depth: 6 cm Catheter type: closed end flexible Catheter size: 20 Guage Catheter at skin depth: 11 cm Test dose: negative and Other  Assessment Events: blood not aspirated, injection not painful, no injection resistance and negative IV test  Additional Notes Informed consent obtained prior to proceeding including risk of failure, 1% risk of PDPH, risk of minor discomfort and bruising.  Discussed rare but serious complications including epidural abscess, permanent nerve injury, epidural hematoma.  Discussed alternatives to epidural analgesia and patient desires to proceed.  Timeout performed pre-procedure verifying patient name, procedure, and platelet count.  Patient tolerated procedure well.

## 2019-11-30 NOTE — Anesthesia Preprocedure Evaluation (Signed)
Anesthesia Evaluation  Patient identified by MRN, date of birth, ID band  Reviewed: Allergy & Precautions, NPO status , Patient's Chart, lab work & pertinent test results  Airway Mallampati: III  TM Distance: >3 FB Neck ROM: Full    Dental no notable dental hx.    Pulmonary Current Smoker,    Pulmonary exam normal breath sounds clear to auscultation       Cardiovascular hypertension, negative cardio ROS Normal cardiovascular exam Rhythm:Regular Rate:Normal     Neuro/Psych negative neurological ROS     GI/Hepatic negative GI ROS, Neg liver ROS,   Endo/Other  Obesity   Renal/GU negative Renal ROS  negative genitourinary   Musculoskeletal negative musculoskeletal ROS (+)   Abdominal   Peds negative pediatric ROS (+)  Hematology negative hematology ROS (+)   Anesthesia Other Findings   Reproductive/Obstetrics                             Anesthesia Physical Anesthesia Plan  ASA: II  Anesthesia Plan: Epidural   Post-op Pain Management:    Induction:   PONV Risk Score and Plan:   Airway Management Planned:   Additional Equipment:   Intra-op Plan:   Post-operative Plan:   Informed Consent: I have reviewed the patients History and Physical, chart, labs and discussed the procedure including the risks, benefits and alternatives for the proposed anesthesia with the patient or authorized representative who has indicated his/her understanding and acceptance.       Plan Discussed with: Anesthesiologist  Anesthesia Plan Comments:         Anesthesia Quick Evaluation

## 2019-11-30 NOTE — Progress Notes (Addendum)
Patient ID: Bianca Jenkins, female   DOB: 1985/05/09, 34 y.o.   MRN: [id number]  Feels ctx about the same; required IV Lab x 3 doses as well as evening dose of Lab 200mg  PO; is asymptomatic for pre-e  BP 159/100, 179/91, 180/100 FHR 120s, +accels, no decels, Cat 1 Ctx q 2 mins, spont Cx 2/50/vtx -2; sm bright red blood on glove after exam, no active bleeding nor was blood seen on towel  IUP@37 .2wks cHTN now SIPE- severe range BPs IUGR Favorable cx  Cervical foley placed without complication Will start mag sulfate Plan Pitocin when foley comes out  CNM 11/30/2019 1:00 AM

## 2019-12-01 LAB — COMPREHENSIVE METABOLIC PANEL
ALT: 13 U/L (ref 0–44)
AST: 19 U/L (ref 15–41)
Albumin: 2.7 g/dL — ABNORMAL LOW (ref 3.5–5.0)
Alkaline Phosphatase: 105 U/L (ref 38–126)
Anion gap: 10 (ref 5–15)
BUN: <5 mg/dL — ABNORMAL LOW (ref 6–20)
CO2: 25 mmol/L (ref 22–32)
Calcium: 7.1 mg/dL — ABNORMAL LOW (ref 8.9–10.3)
Chloride: 100 mmol/L (ref 98–111)
Creatinine, Ser: 0.84 mg/dL (ref 0.44–1.00)
GFR calc Af Amer: >60 mL/min (ref >60)
GFR calc non Af Amer: >60 mL/min (ref >60)
Glucose, Bld: 83 mg/dL (ref 70–99)
Potassium: 3.5 mmol/L (ref 3.5–5.1)
Sodium: 135 mmol/L (ref 135–145)
Total Bilirubin: 0.5 mg/dL (ref 0.3–1.2)
Total Protein: 6.6 g/dL (ref 6.5–8.1)

## 2019-12-01 LAB — CBC
HCT: 32.9 % — ABNORMAL LOW (ref 36.0–46.0)
Hemoglobin: 10.8 g/dL — ABNORMAL LOW (ref 12.0–15.0)
MCH: 27.9 pg (ref 26.0–34.0)
MCHC: 32.8 g/dL (ref 30.0–36.0)
MCV: 85 fL (ref 80.0–100.0)
Platelets: 282 10*3/uL (ref 150–400)
RBC: 3.87 MIL/uL (ref 3.87–5.11)
RDW: 14.3 % (ref 11.5–15.5)
WBC: 11.7 10*3/uL — ABNORMAL HIGH (ref 4.0–10.5)
nRBC: 0 % (ref 0.0–0.2)

## 2019-12-01 LAB — MAGNESIUM: Magnesium: 6.7 mg/dL (ref 1.7–2.4)

## 2019-12-01 MED ORDER — LABETALOL HCL 5 MG/ML IV SOLN: 20.0000 mg | INTRAVENOUS | Status: DC | PRN

## 2019-12-01 MED ORDER — HYDRALAZINE HCL 20 MG/ML IJ SOLN: 10.0000 mg | INTRAMUSCULAR | Status: DC | PRN

## 2019-12-01 MED ORDER — NIFEDIPINE 10 MG PO CAPS
20.0000 mg | ORAL_CAPSULE | ORAL | Status: DC | PRN
  Administered 2019-12-01: 20 mg via ORAL
  Filled 2019-12-01: qty 2

## 2019-12-01 MED ORDER — LABETALOL HCL 5 MG/ML IV SOLN: 40.0000 mg | INTRAVENOUS | Status: DC | PRN

## 2019-12-01 MED ORDER — HYDRALAZINE HCL 20 MG/ML IJ SOLN
INTRAMUSCULAR | Status: AC
  Filled 2019-12-01: qty 1

## 2019-12-01 MED ORDER — NIFEDIPINE 10 MG PO CAPS
10.0000 mg | ORAL_CAPSULE | ORAL | Status: DC | PRN
  Administered 2019-12-01 – 2019-12-02 (×2): 10 mg via ORAL
  Filled 2019-12-01: qty 1

## 2019-12-01 MED ORDER — HYDRALAZINE HCL 20 MG/ML IJ SOLN: 5.0000 mg | INTRAMUSCULAR | Status: DC | PRN

## 2019-12-01 MED ORDER — NIFEDIPINE ER OSMOTIC RELEASE 30 MG PO TB24
60.0000 mg | ORAL_TABLET | Freq: Every day | ORAL | Status: DC
  Administered 2019-12-01: 60 mg via ORAL
  Filled 2019-12-01: qty 2

## 2019-12-01 MED ORDER — NIFEDIPINE 10 MG PO CAPS
20.0000 mg | ORAL_CAPSULE | ORAL | Status: DC | PRN
  Filled 2019-12-01 (×2): qty 2

## 2019-12-01 NOTE — Progress Notes (Signed)
Post Partum Day 1 Subjective: got weak from Magnesium. Level at 6.7. Magnesium stopped.  Objective: Blood pressure 140/82, pulse (!) 55, temperature 98.4 F (36.9 C), temperature source Oral, resp. rate 16, height 5' (1.524 m), weight 83.9 kg, last menstrual period 03/14/2019, SpO2 97 %, unknown if currently breastfeeding.  Physical Exam:  General: alert, cooperative and appears stated age Lochia: appropriate Uterine Fundus: firm DVT Evaluation: No evidence of DVT seen on physical exam.  Recent Labs    11/30/19 0823 12/01/19 0600  HGB 10.6* 10.8*  HCT 31.8* 32.9*    Assessment/Plan: Plan for discharge tomorrow  Increased her procardia to 60 mg.   LOS: 2 days   Bianca Jenkins 12/01/2019, 8:46 AM

## 2019-12-01 NOTE — Anesthesia Postprocedure Evaluation (Signed)
Anesthesia Post Note  Patient: Bianca Jenkins  Procedure(s) Performed: AN AD HOC LABOR EPIDURAL     Patient location during evaluation: OB High Risk Anesthesia Type: Epidural Level of consciousness: awake and alert Pain management: pain level controlled Vital Signs Assessment: post-procedure vital signs reviewed and stable Respiratory status: spontaneous breathing Cardiovascular status: stable Postop Assessment: no headache, no backache, adequate PO intake, able to ambulate, patient able to bend at knees, epidural receding and no apparent nausea or vomiting Anesthetic complications: no   No complications documented.  Last Vitals:  Vitals:   12/01/19 0748 12/01/19 0759  BP: (!) 161/94 140/82  Pulse: (!) 55 (!) 55  Resp:    Temp:    SpO2:      Last Pain:  Vitals:   12/01/19 0750  TempSrc:   PainSc: 0-No pain   Pain Goal: Patients Stated Pain Goal: 0 (11/30/19 1107)                 Jennye Moccasin, [place] Picking

## 2019-12-02 MED ORDER — NIFEDIPINE ER OSMOTIC RELEASE 30 MG PO TB24
90.0000 mg | ORAL_TABLET | Freq: Every day | ORAL | Status: DC
  Administered 2019-12-02: 90 mg via ORAL
  Filled 2019-12-02: qty 3

## 2019-12-02 MED ORDER — IBUPROFEN 600 MG PO TABS: 600.0000 mg | ORAL_TABLET | Freq: Three times a day (TID) | ORAL | 0 refills | Status: AC

## 2019-12-02 MED ORDER — NIFEDIPINE ER OSMOTIC RELEASE 90 MG PO TB24
90.0000 mg | ORAL_TABLET | Freq: Every day | ORAL | 1 refills | Status: AC
Start: 2019-12-02 — End: ?

## 2019-12-02 MED FILL — NIFEdipine ER 90 MG TB24: 90 | 30 days supply | Qty: 30 | Fill #0

## 2019-12-02 MED FILL — IBUPROFEN 600 MG TABLET: 600 | 10 days supply | Qty: 30 | Fill #0

## 2019-12-02 NOTE — Discharge Instructions (Signed)

## 2019-12-07 ENCOUNTER — Ambulatory Visit: Payer: Medicaid Other

## 2019-12-08 ENCOUNTER — Ambulatory Visit: Payer: Medicaid Other

## 2019-12-15 ENCOUNTER — Other Ambulatory Visit: Payer: Self-pay

## 2019-12-15 ENCOUNTER — Ambulatory Visit: Payer: Medicaid Other | Admitting: *Deleted

## 2019-12-15 VITALS — BP 190/102

## 2019-12-15 DIAGNOSIS — O165 Unspecified maternal hypertension, complicating the puerperium: Secondary | ICD-10-CM

## 2019-12-15 NOTE — Progress Notes (Addendum)
Subjective:  Bianca Jenkins is a 34 y.o. female here for BP check.   Hypertension ROS: not taking medications regularly as instructed, medication side effects include: no side effects noted and 'heart racing', no TIA's, no chest pain on exertion, no dyspnea on exertion and no swelling of ankles  Pt states she stopped taking Procardia 2-3 days after she was discharged. Stating she felt like her heart was racing.   Objective: BP (!) 190/102   LMP 03/14/2019                    Additional reading 203/103  Appearance alert, well appearing, and in no distress.  General exam BP noted to be uncontrolled today in office.   Assessment:   Blood Pressure needs further observation.   Plan:  Per Dr Donavan Foil: To be seen at [facility] for evaluation.  Pt states she may not be able to go today due to child care. Stressed importance of being evaluated for PP hypertension. Continue/Start medication as prescribed. Contact office for any noted side effects. Advised to check BP at home and contact office with reading. F/u has been scheduled for next week - nurse bp check.

## 2019-12-15 NOTE — Progress Notes (Signed)
Patient was assessed and managed by nursing staff during this encounter. I have reviewed the chart and agree with the documentation and plan. I have also made any necessary editorial changes.  Again emphasized that pt should be evaluated at the [facility] for severe range BP and she is at risk for stroke or organ damage.  Pt appears to be noncompliant with antihypertensive medicine.  She needs to restart her meds.  Warden Fillers, MD 12/15/2019 5:10 PM

## 2019-12-21 ENCOUNTER — Ambulatory Visit: Payer: Medicaid Other

## 2019-12-28 ENCOUNTER — Ambulatory Visit: Payer: Medicaid Other | Admitting: Obstetrics and Gynecology

## 2019-12-30 ENCOUNTER — Other Ambulatory Visit: Payer: Self-pay | Admitting: Obstetrics

## 2020-01-12 ENCOUNTER — Ambulatory Visit: Payer: Medicaid Other | Admitting: Obstetrics and Gynecology

## 2020-02-02 ENCOUNTER — Other Ambulatory Visit: Payer: Self-pay | Admitting: Obstetrics & Gynecology

## 2020-02-04 ENCOUNTER — Other Ambulatory Visit: Payer: Self-pay | Admitting: Obstetrics

## 2020-02-04 DIAGNOSIS — O09899 Supervision of other high risk pregnancies, unspecified trimester: Secondary | ICD-10-CM

## 2020-02-04 DIAGNOSIS — I10 Essential (primary) hypertension: Secondary | ICD-10-CM

## 2021-11-04 IMAGING — US US MFM FETAL BPP W/O NON-STRESS
1 series · 12 of 28 positions shown · non-contrast
Comparison: none

[Series 1: us mfm fetal bpp w/o non-stress · 29 acquisitions, 12 frames shown]
[im 2/29]
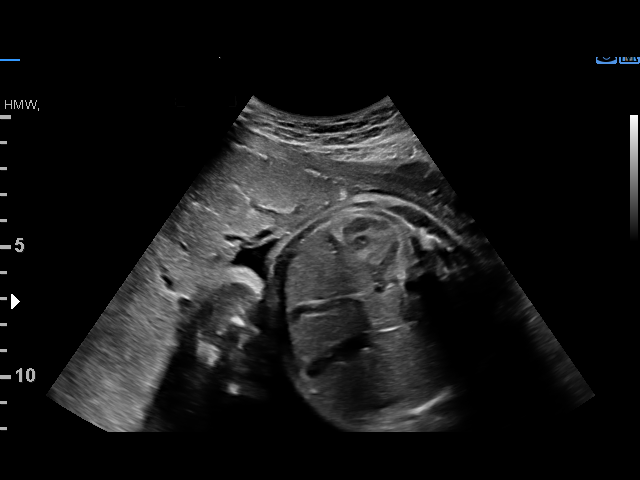
[im 4/29]
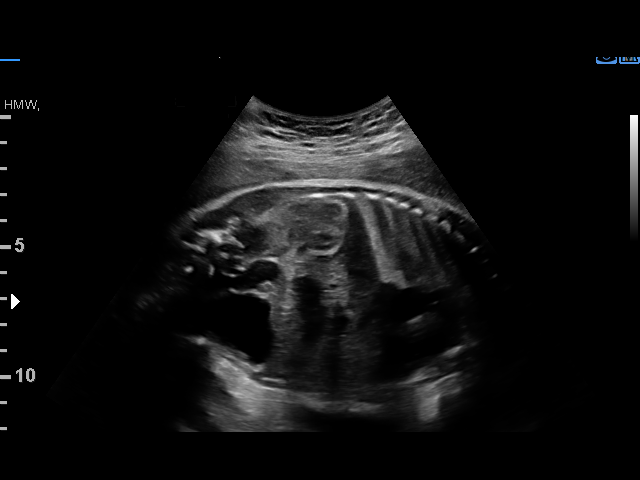
[im 6/29]
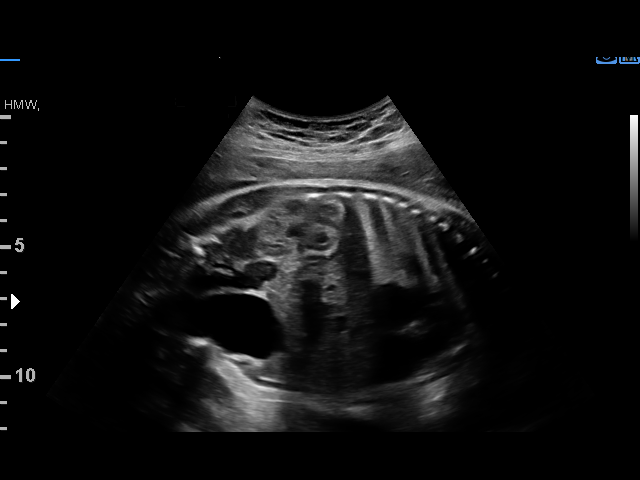
[im 9/29]
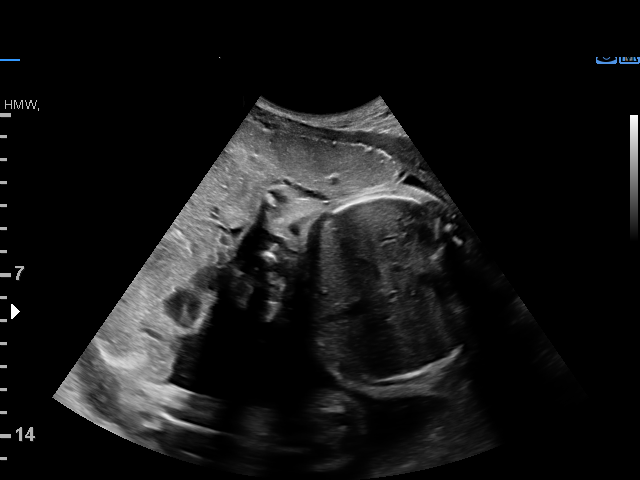
[im 11/29]
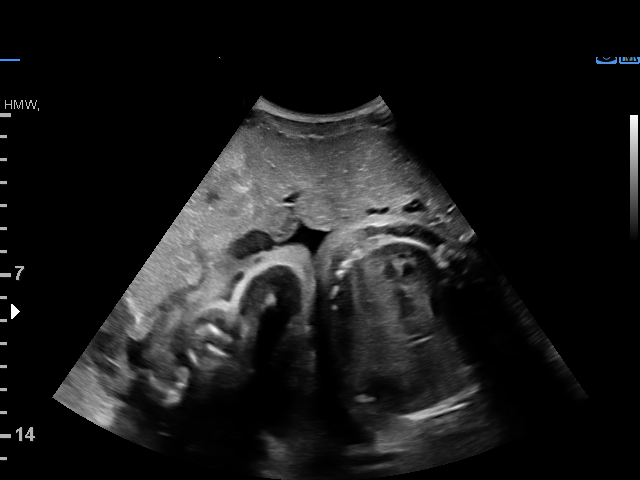
[im 13/29]
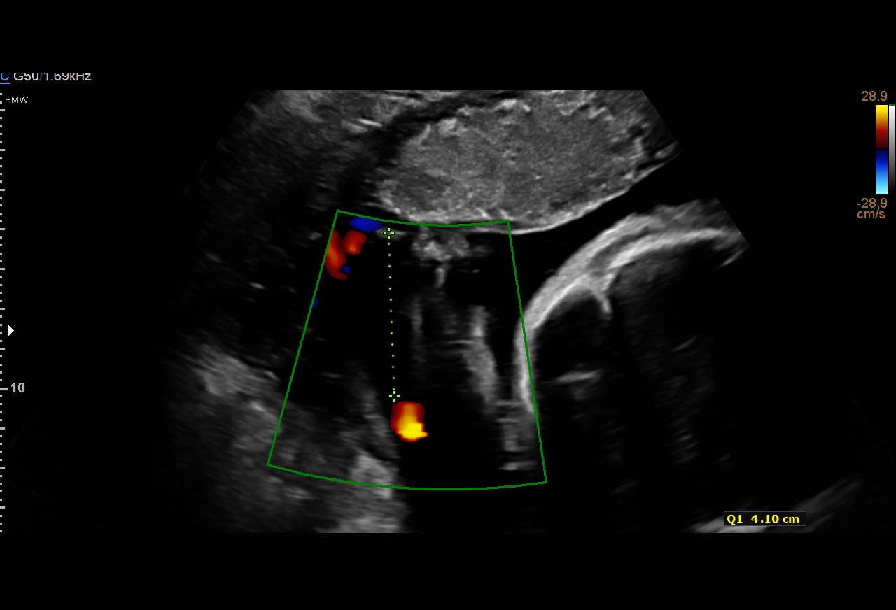
[im 16/29]
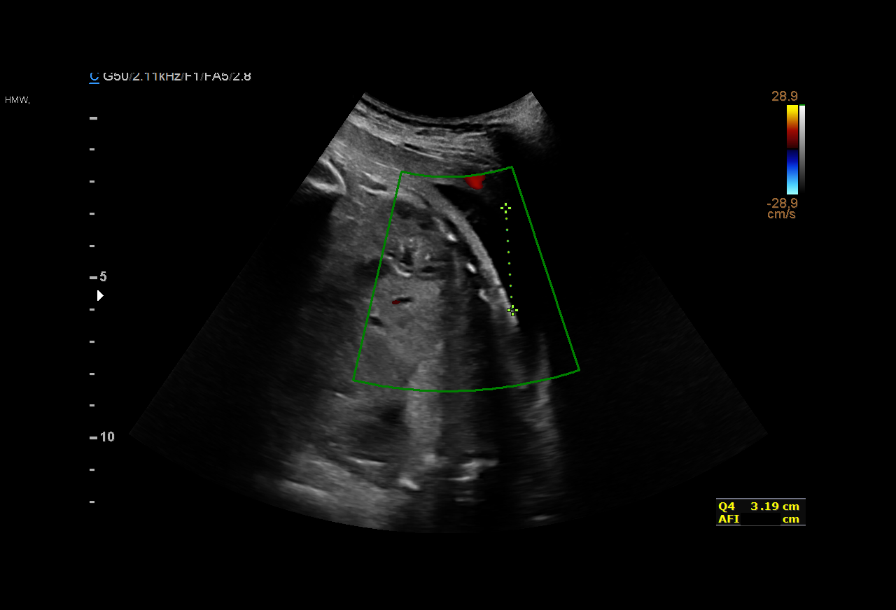
[im 18/29]
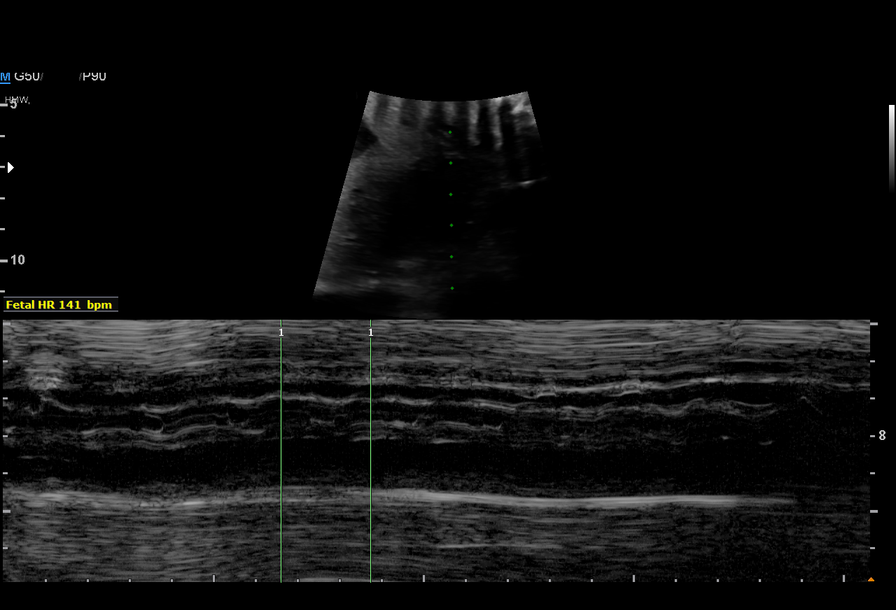
[im 20/29]
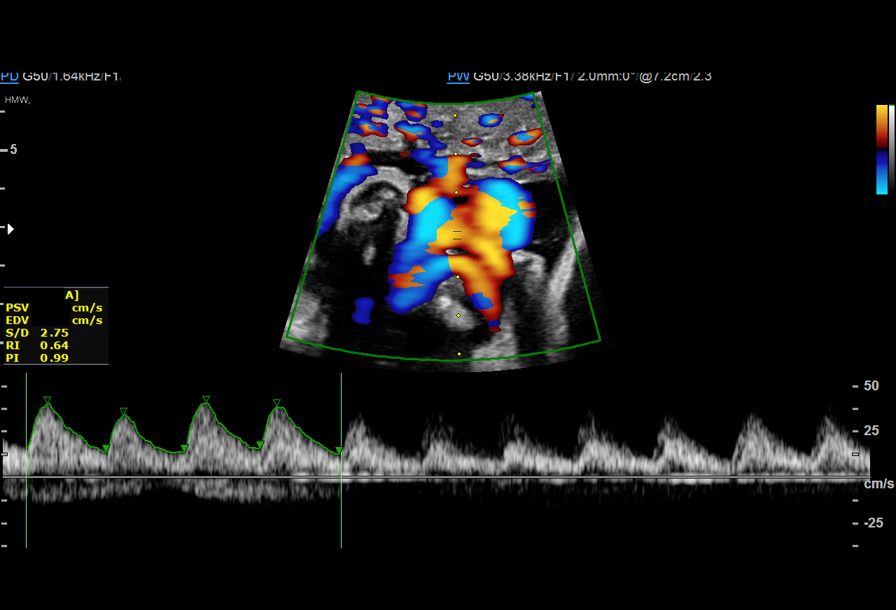
[im 23/29]
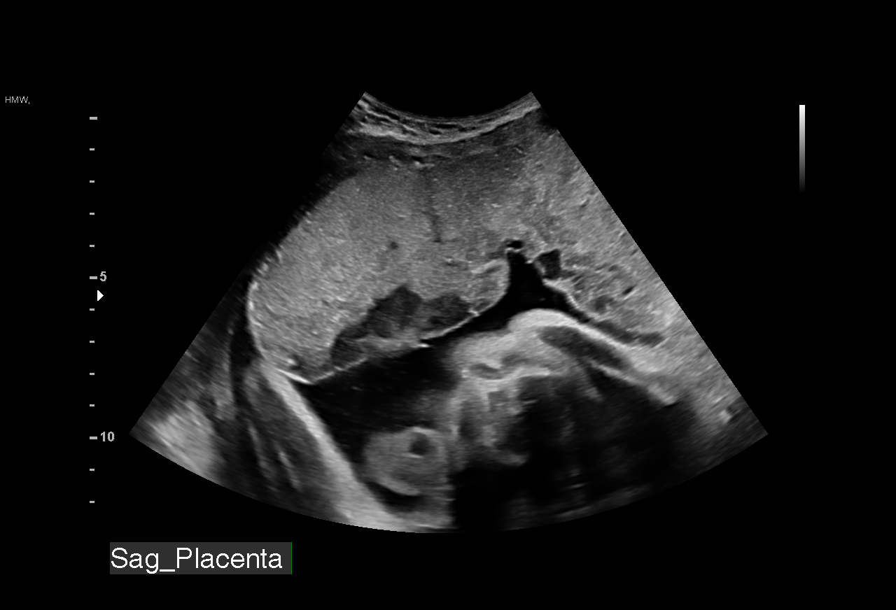
[im 25/29]
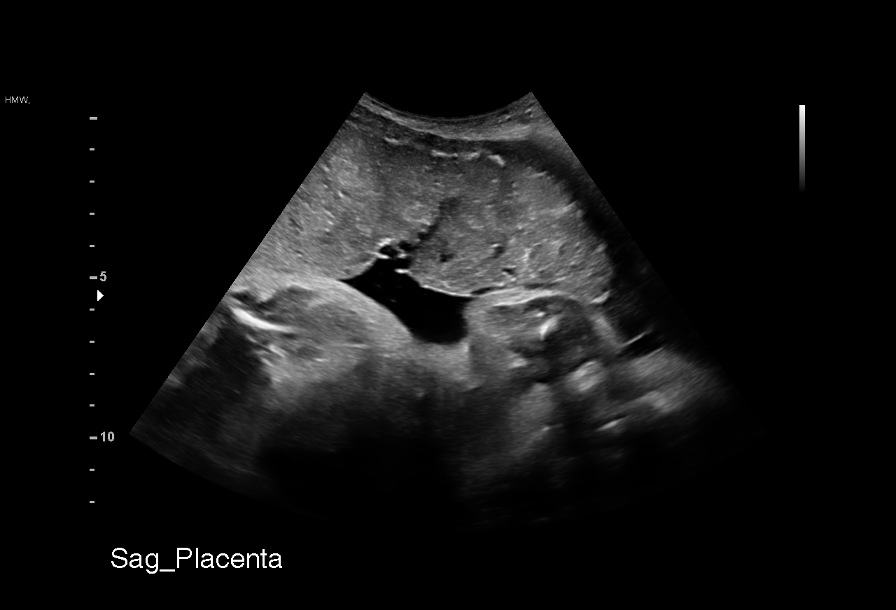
[im 27/29]
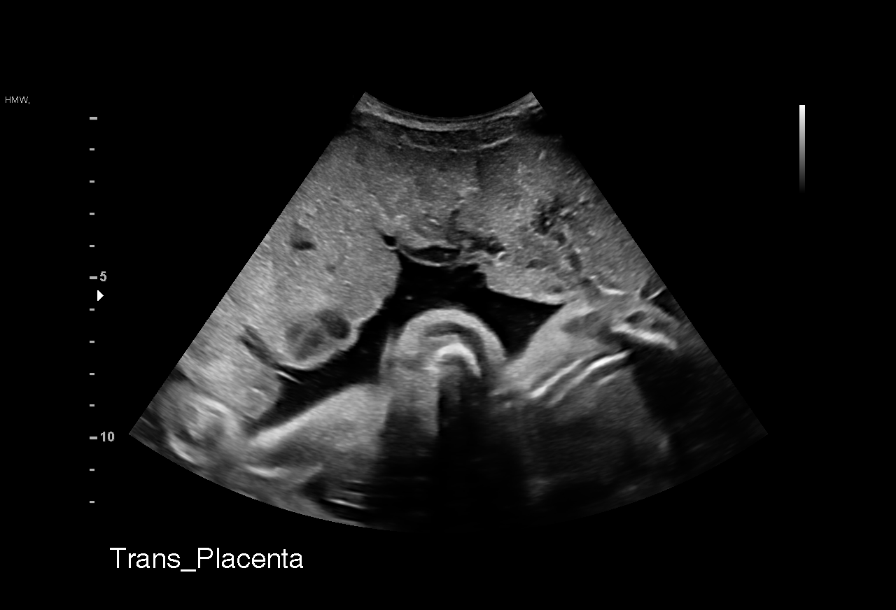

[12 of 28 positions shown; findings below may reference images not displayed]

Indications

 Maternal care for known or suspected poor
 fetal growth, third trimester, not applicable
 or unspecified IUGR
 34 weeks gestation of pregnancy
 Encounter for other antenatal screening
 follow-up
 Hypertension - Chronic/Pre-existing
 (labetalol)
 Obesity complicating pregnancy, third
 trimester
 Genetic carrier (silent carrier for Alexander Kevin Manuyama,
 increased risk carrier for SMA)
 Low Risk NIPS
Vital Signs

                                                Height:        5'0"
Fetal Evaluation

 Num Of Fetuses:          1
 Fetal Heart              141
 Rate(bpm):
 Cardiac Activity:        Observed
 Presentation:            Cephalic
 Placenta:                Anterior

 Amniotic Fluid
 AFI FV:      Within normal limits

 AFI Sum(cm)     %Tile       Largest Pocket(cm)
 12.38           37

 RUQ(cm)       RLQ(cm)       LUQ(cm)        LLQ(cm)

Biophysical Evaluation

 Amniotic F.V:   Within normal limits       F. Tone:         Observed
 F. Movement:    Observed                   Score:           [DATE]
 F. Breathing:   Observed
OB History

 Gravidity:    3         Term:   2
 Living:       2
Gestational Age

 LMP:           34w 3d        Date:  03/14/19                 EDD:    12/19/19
 Best:          34w 3d     Det. By:  LMP  (03/14/19)          EDD:    12/19/19
Anatomy

 Stomach:               Appears normal,        Bladder:                Appears normal
                        left sided
 Kidneys:               Appear normal
Doppler - Fetal Vessels

 Umbilical Artery
   S/D    %tile      RI    %tile                             ADFV    RDFV
  2.75       65    0.64       73                                 N        N

Impression

 Antenatal testing due to IUGR EFW [DATE]
 Biophysical profile with normal UA Dopplers, amniotic fluid
 and fetal movement.
Recommendations

 Continue weekly testing with UA Dopplers
 Repeat growth in 3 weeks
 Consider delivery by 37 given EFW < 3%

## 2022-03-31 ENCOUNTER — Other Ambulatory Visit: Payer: Self-pay

## 2022-03-31 ENCOUNTER — Emergency Department (HOSPITAL_COMMUNITY): Payer: Medicaid Other

## 2022-03-31 ENCOUNTER — Emergency Department (HOSPITAL_COMMUNITY)
Admission: EM | Admit: 2022-03-31 | Discharge: 2022-03-31 | Disposition: A | Discharge status: Home / Self Care | Payer: Medicaid Other | Attending: Emergency Medicine | Admitting: Emergency Medicine

## 2022-03-31 ENCOUNTER — Encounter (HOSPITAL_COMMUNITY): Payer: Self-pay

## 2022-03-31 DIAGNOSIS — S0081XA Abrasion of other part of head, initial encounter: Secondary | ICD-10-CM | POA: Diagnosis not present

## 2022-03-31 DIAGNOSIS — R519 Headache, unspecified: Secondary | ICD-10-CM | POA: Diagnosis not present

## 2022-03-31 DIAGNOSIS — Y92410 Unspecified street and highway as the place of occurrence of the external cause: Secondary | ICD-10-CM | POA: Insufficient documentation

## 2022-03-31 DIAGNOSIS — M542 Cervicalgia: Secondary | ICD-10-CM | POA: Insufficient documentation

## 2022-03-31 DIAGNOSIS — S0993XA Unspecified injury of face, initial encounter: Secondary | ICD-10-CM | POA: Diagnosis present

## 2022-03-31 MED ORDER — FLUORESCEIN SODIUM 1 MG OP STRP
1.0000 | ORAL_STRIP | Freq: Once | OPHTHALMIC | Status: AC
  Administered 2022-03-31: 1 via OPHTHALMIC
  Filled 2022-03-31: qty 1

## 2022-03-31 MED ORDER — TETRACAINE HCL 0.5 % OP SOLN
2.0000 [drp] | Freq: Once | OPHTHALMIC | Status: AC
  Administered 2022-03-31: 2 [drp] via OPHTHALMIC
  Filled 2022-03-31: qty 4

## 2022-03-31 NOTE — Discharge Instructions (Signed)
I discussed, workup today consistent with fracture of the lower part of your eye socket.  Recommend taking Tylenol/Motrin as needed at home for pain.  Recommend follow-up with Dr. Stefanie Libel discharge papers for reevaluation of your symptoms.  Please not hesitate to return to emergency department for worrisome signs and symptoms we discussed become apparent.
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
## 2023-10-22 ENCOUNTER — Ambulatory Visit (HOSPITAL_COMMUNITY)
Admission: EM | Admit: 2023-10-22 | Discharge: 2023-10-22 | Disposition: A | Discharge status: Home / Self Care | Payer: Self-pay | Attending: Internal Medicine | Admitting: Internal Medicine

## 2023-10-22 ENCOUNTER — Encounter (HOSPITAL_COMMUNITY): Payer: Self-pay

## 2023-10-22 DIAGNOSIS — S161XXA Strain of muscle, fascia and tendon at neck level, initial encounter: Secondary | ICD-10-CM
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
## 2024-01-16 ENCOUNTER — Ambulatory Visit (HOSPITAL_COMMUNITY): Admission: EM | Admit: 2024-01-16 | Discharge: 2024-01-16 | Disposition: A | Discharge status: Home / Self Care

## 2024-01-16 ENCOUNTER — Other Ambulatory Visit: Payer: Self-pay

## 2024-01-16 ENCOUNTER — Encounter (HOSPITAL_COMMUNITY): Payer: Self-pay | Admitting: *Deleted

## 2024-01-16 DIAGNOSIS — W540XXA Bitten by dog, initial encounter: Secondary | ICD-10-CM | POA: Diagnosis not present

## 2024-01-16 DIAGNOSIS — Z23 Encounter for immunization: Secondary | ICD-10-CM

## 2024-01-16 DIAGNOSIS — S61451A Open bite of right hand, initial encounter: Secondary | ICD-10-CM

## 2024-01-16 MED ORDER — AMOXICILLIN-POT CLAVULANATE 875-125 MG PO TABS: 1.0000 | ORAL_TABLET | Freq: Two times a day (BID) | ORAL | 0 refills | Status: AC

## 2024-01-16 MED ORDER — RABIES VIRUS VACCINE, HDC IM SUSR
INTRAMUSCULAR | Status: AC
  Filled 2024-01-16: qty 1

## 2024-01-16 MED ORDER — RABIES VIRUS VACCINE, HDC IM SUSR
1.0000 mL | Freq: Once | INTRAMUSCULAR | Status: AC
  Administered 2024-01-16: 1 mL via INTRAMUSCULAR

## 2024-01-16 MED ORDER — BACITRACIN ZINC 500 UNIT/GM EX OINT: TOPICAL_OINTMENT | Freq: Once | CUTANEOUS | Status: AC

## 2024-01-16 MED ORDER — RABIES IMMUNE GLOBULIN 1500 UNIT/10ML IJ SOLN
INTRAMUSCULAR | Status: AC
  Filled 2024-01-16: qty 10

## 2024-01-16 MED ORDER — RABIES IMMUNE GLOBULIN 1500 UNIT/10ML IJ SOLN
20.0000 [IU]/kg | Freq: Once | INTRAMUSCULAR | Status: AC
  Administered 2024-01-16: 1500 [IU] via INTRAMUSCULAR

## 2024-01-16 NOTE — Discharge Instructions (Addendum)
  1. Dog bite of right hand, initial encounter (Primary) - amoxicillin -clavulanate (AUGMENTIN ) 875-125 MG tablet; Take 1 tablet by mouth every 12 (twelve) hours.  Dispense: 14 tablet; Refill: 0  2. Need for rabies vaccination - Rabies Immune Globulin SOLN 1,500 Units given in UC for post animal bite rabies prophylaxis - rabies vaccine, human diploid (IMOVAX) injection 1 mL given in UC for post animal bite rabies prophylaxis - Return to urgent care for reevaluation and second round of rabies vaccine on Monday, 01/19/2024.  - To complete entire rabies prophylaxis course, must have rabies vaccination given on day 0 (day of initial injury), day 3, day 7, day 14, day 28 to complete vaccination.  You may return to urgent care for reevaluation and further vaccination on these specific days. -Continue to monitor symptoms for any change in severity if there is any escalation of current symptoms or development of new symptoms follow-up in ER for further evaluation and management.

## 2024-01-16 NOTE — ED Triage Notes (Addendum)
 PT was bite by a Cousins dog. DOG does not have a up to date rabies vaccine. Bite site on Rt palm .

## 2024-01-16 NOTE — ED Provider Notes (Signed)
 [facility]  Note:  This document was prepared using Conservation officer, historic buildings and may include unintentional dictation errors.  MRN: [id number] DOB: [birth year]  Subjective:   Bianca Jenkins is a 38 y.o. female presenting for evaluation of dog bite to right hand.  Patient reports that she was bit by her cousin's dog who she claims is not up-to-date with vaccinations according to her cousin.  Patient reports that dog was not acting differently than normal.  Patient is concern for possible need for rabies vaccination due to dog bite.  No current facility-administered medications for this encounter.  Current Outpatient Medications:    amoxicillin -clavulanate (AUGMENTIN ) 875-125 MG tablet, Take 1 tablet by mouth every 12 (twelve) hours., Disp: 14 tablet, Rfl: 0   ibuprofen  (ADVIL ) 600 MG tablet, Take 1 tablet (600 mg total) by mouth every 8 (eight) hours., Disp: 30 tablet, Rfl: 0   NIFEdipine  (PROCARDIA  XL/NIFEDICAL-XL) 90 MG 24 hr tablet, Take 1 tablet (90 mg total) by mouth daily. (Patient not taking: Reported on 12/15/2019), Disp: 30 tablet, Rfl: 1   Prenat w/o A-FeCbn-Meth-FA-DHA (PRENATE MINI ) 29-0.6-0.4-350 MG CAPS, Take 1 capsule by mouth daily before breakfast., Disp: 90 capsule, Rfl: 3   No Known Allergies  Past Medical History:  Diagnosis Date   Hypertension      Past Surgical History:  Procedure Laterality Date   NO PAST SURGERIES      Family History  Problem Relation Age of Onset   Hypertension Mother    Hypertension Maternal Grandmother     Social History   Tobacco Use   Smoking status: Every Day    Current packs/day: 0.10    Types: Cigarettes   Smokeless tobacco: Never  Vaping Use   Vaping status: Never Used  Substance Use Topics   Alcohol use: No   Drug use: No    ROS Refer to HPI for ROS details.  Objective:    Vitals: BP (!) 172/109   Pulse 83   Temp 98.8 F (37.1 C)   Resp 20   Wt 180 lb (81.6 kg)   LMP 01/09/2024  (Approximate)   SpO2 97%   BMI 35.15 kg/m   Physical Exam Vitals and nursing note reviewed.  Constitutional:      General: She is not in acute distress.    Appearance: Normal appearance. She is not ill-appearing.  HENT:     Head: Normocephalic.  Cardiovascular:     Rate and Rhythm: Normal rate.  Pulmonary:     Effort: Pulmonary effort is normal. No respiratory distress.  Musculoskeletal:     Right hand: Tenderness present. No swelling, deformity or bony tenderness. Normal range of motion. Normal strength. Normal sensation. Normal capillary refill. Normal pulse.       Hands:  Skin:    General: Skin is warm and dry.     Capillary Refill: Capillary refill takes less than 2 seconds.  Neurological:     General: No focal deficit present.     Mental Status: She is alert and oriented to person, place, and time.  Psychiatric:        Mood and Affect: Mood normal.        Behavior: Behavior normal.     Procedures  No results found for this or any previous visit (from the past 24 hours).  Assessment and Plan :     Discharge Instructions       1. Dog bite of right hand, initial encounter (Primary) - amoxicillin -clavulanate (AUGMENTIN ) 875-125 MG  tablet; Take 1 tablet by mouth every 12 (twelve) hours.  Dispense: 14 tablet; Refill: 0  2. Need for rabies vaccination - Rabies Immune Globulin SOLN 1,500 Units given in UC for post animal bite rabies prophylaxis - rabies vaccine, human diploid (IMOVAX) injection 1 mL given in UC for post animal bite rabies prophylaxis - Return to urgent care for reevaluation and second round of rabies vaccine on Monday, 01/19/2024.  - To complete entire rabies prophylaxis course, must have rabies vaccination given on day 0 (day of initial injury), day 3, day 7, day 14, day 28 to complete vaccination.  You may return to urgent care for reevaluation and further vaccination on these specific days. -Continue to monitor symptoms for any change in severity  if there is any escalation of current symptoms or development of new symptoms follow-up in ER for further evaluation and management.      Deleon Passe B Markeria Goetsch   Lauraine Crespo, [place], [place] 01/16/24 [phone]

## 2024-01-19 ENCOUNTER — Other Ambulatory Visit: Payer: Self-pay

## 2024-01-19 ENCOUNTER — Ambulatory Visit (HOSPITAL_COMMUNITY)
Admission: EM | Admit: 2024-01-19 | Discharge: 2024-01-19 | Disposition: A | Discharge status: Home / Self Care | Attending: Physician Assistant | Admitting: Physician Assistant

## 2024-01-19 DIAGNOSIS — Z203 Contact with and (suspected) exposure to rabies: Secondary | ICD-10-CM

## 2024-01-19 MED ORDER — RABIES VIRUS VACCINE, HDC IM SUSR
INTRAMUSCULAR | Status: AC
  Filled 2024-01-19: qty 1

## 2024-01-19 MED ORDER — RABIES VIRUS VACCINE, HDC IM SUSR: 1.0000 mL | Freq: Once | INTRAMUSCULAR | Status: DC

## 2024-01-19 NOTE — ED Triage Notes (Signed)
 PT presents fo r# 2 rabies vaccine.

## 2024-01-25 ENCOUNTER — Inpatient Hospital Stay (HOSPITAL_COMMUNITY)
Admission: RE | Admit: 2024-01-25 | Discharge: 2024-01-25 | Attending: Physician Assistant | Admitting: Physician Assistant

## 2024-01-25 DIAGNOSIS — Z203 Contact with and (suspected) exposure to rabies: Secondary | ICD-10-CM

## 2024-01-25 DIAGNOSIS — Z23 Encounter for immunization: Secondary | ICD-10-CM

## 2024-01-25 MED ORDER — RABIES VIRUS VACCINE, HDC IM SUSR
INTRAMUSCULAR | Status: AC
  Filled 2024-01-25: qty 1

## 2024-01-25 MED ORDER — RABIES VIRUS VACCINE, HDC IM SUSR: 1.0000 mL | Freq: Once | INTRAMUSCULAR | Status: DC

## 2024-01-25 MED ORDER — RABIES VIRUS VACCINE, HDC IM SUSR
1.0000 mL | Freq: Once | INTRAMUSCULAR | Status: AC
  Administered 2024-01-25: 1 mL via INTRAMUSCULAR

## 2024-01-25 NOTE — ED Notes (Signed)
 Vaccine given in the left deltoid and tolerated well.

## 2024-01-25 NOTE — ED Triage Notes (Signed)
 Patient presenting for her third rabies vaccine (day 7). Denies any reaction to the previous doses.

## 2024-02-04 ENCOUNTER — Ambulatory Visit (HOSPITAL_COMMUNITY)

## 2024-02-08 ENCOUNTER — Ambulatory Visit (HOSPITAL_COMMUNITY)
Admission: EM | Admit: 2024-02-08 | Discharge: 2024-02-08 | Disposition: A | Discharge status: Home / Self Care | Attending: Emergency Medicine | Admitting: Emergency Medicine

## 2024-02-08 ENCOUNTER — Encounter (HOSPITAL_COMMUNITY): Payer: Self-pay

## 2024-02-08 DIAGNOSIS — Z23 Encounter for immunization: Secondary | ICD-10-CM

## 2024-02-08 DIAGNOSIS — Z203 Contact with and (suspected) exposure to rabies: Secondary | ICD-10-CM | POA: Diagnosis not present

## 2024-02-08 MED ORDER — RABIES VIRUS VACCINE, HDC IM SUSR
INTRAMUSCULAR | Status: AC
  Filled 2024-02-08: qty 1

## 2024-02-08 MED ORDER — RABIES VIRUS VACCINE, HDC IM SUSR
1.0000 mL | Freq: Once | INTRAMUSCULAR | Status: AC
  Administered 2024-02-08: 1 mL via INTRAMUSCULAR

## 2024-02-08 NOTE — ED Triage Notes (Signed)
 Patient states she is needing her last Rabies vaccine and is late coming due to vehicle breaking down.
